# Patient Record
Sex: Female | Born: 1980
Health system: Southern US, Community
[De-identification: ages and names within clinical notes are randomized; demographics above are authoritative.]

## PROBLEM LIST (undated history)

## (undated) DIAGNOSIS — I498 Other specified cardiac arrhythmias: Secondary | ICD-10-CM

## (undated) DIAGNOSIS — N390 Urinary tract infection, site not specified: Secondary | ICD-10-CM

## (undated) DIAGNOSIS — K8020 Calculus of gallbladder without cholecystitis without obstruction: Secondary | ICD-10-CM

## (undated) DIAGNOSIS — J189 Pneumonia, unspecified organism: Secondary | ICD-10-CM

## (undated) DIAGNOSIS — R000 Tachycardia, unspecified: Secondary | ICD-10-CM

## (undated) DIAGNOSIS — I951 Orthostatic hypotension: Secondary | ICD-10-CM

## (undated) DIAGNOSIS — Z9889 Other specified postprocedural states: Secondary | ICD-10-CM

## (undated) DIAGNOSIS — R112 Nausea with vomiting, unspecified: Secondary | ICD-10-CM

## (undated) DIAGNOSIS — K589 Irritable bowel syndrome without diarrhea: Secondary | ICD-10-CM

## (undated) DIAGNOSIS — K219 Gastro-esophageal reflux disease without esophagitis: Secondary | ICD-10-CM

## (undated) DIAGNOSIS — G90A Postural orthostatic tachycardia syndrome (POTS): Secondary | ICD-10-CM

## (undated) HISTORY — DX: Irritable bowel syndrome, unspecified: K58.9

## (undated) HISTORY — DX: Calculus of gallbladder without cholecystitis without obstruction: K80.20

## (undated) HISTORY — DX: Gastro-esophageal reflux disease without esophagitis: K21.9

## (undated) HISTORY — PX: WISDOM TOOTH EXTRACTION: SHX21

## (undated) HISTORY — DX: Urinary tract infection, site not specified: N39.0

## (undated) HISTORY — DX: Pneumonia, unspecified organism: J18.9

---

## 2009-02-15 ENCOUNTER — Ambulatory Visit: Payer: Self-pay | Admitting: Family Medicine

## 2009-02-15 ENCOUNTER — Encounter: Payer: Self-pay | Admitting: Family Medicine

## 2009-02-15 DIAGNOSIS — IMO0002 Reserved for concepts with insufficient information to code with codable children: Secondary | ICD-10-CM | POA: Insufficient documentation

## 2009-02-15 DIAGNOSIS — R5383 Other fatigue: Secondary | ICD-10-CM

## 2009-02-15 DIAGNOSIS — R5381 Other malaise: Secondary | ICD-10-CM | POA: Insufficient documentation

## 2009-02-15 LAB — CONVERTED CEMR LAB
BUN: 12 mg/dL (ref 6–23)
Basophils Absolute: 0 10*3/uL (ref 0.0–0.1)
Basophils Relative: 1 % (ref 0.0–3.0)
Creatinine, Ser: 0.7 mg/dL (ref 0.4–1.2)
Eosinophils Absolute: 0 10*3/uL (ref 0.0–0.7)
GFR calc non Af Amer: 105.15 mL/min (ref >60)
Glucose, Bld: 94 mg/dL (ref 70–99)
HCT: 39.5 % (ref 36.0–46.0)
Hemoglobin: 13.3 g/dL (ref 12.0–15.0)
Lymphs Abs: 1.2 10*3/uL (ref 0.7–4.0)
MCHC: 33.6 g/dL (ref 30.0–36.0)
MCV: 94.8 fL (ref 78.0–100.0)
Neutro Abs: 1.9 10*3/uL (ref 1.4–7.7)
RBC: 4.16 M/uL (ref 3.87–5.11)
RDW: 11.7 % (ref 11.5–14.6)

## 2009-02-21 LAB — CONVERTED CEMR LAB: Vit D, 25-Hydroxy: 21 ng/mL — ABNORMAL LOW (ref 30–89)

## 2009-02-23 ENCOUNTER — Ambulatory Visit: Payer: Self-pay | Admitting: Family Medicine

## 2009-02-23 ENCOUNTER — Encounter: Payer: Self-pay | Admitting: Family Medicine

## 2009-05-06 ENCOUNTER — Ambulatory Visit: Payer: Self-pay | Admitting: Family Medicine

## 2009-05-06 ENCOUNTER — Encounter: Payer: Self-pay | Admitting: Family Medicine

## 2009-05-06 DIAGNOSIS — E559 Vitamin D deficiency, unspecified: Secondary | ICD-10-CM | POA: Insufficient documentation

## 2009-08-16 ENCOUNTER — Ambulatory Visit: Payer: Self-pay | Admitting: Family Medicine

## 2009-08-16 LAB — CONVERTED CEMR LAB
Alkaline Phosphatase: 43 units/L (ref 39–117)
Bilirubin, Direct: 0.1 mg/dL (ref 0.0–0.3)
CO2: 30 meq/L (ref 19–32)
Calcium: 9.1 mg/dL (ref 8.4–10.5)
Creatinine, Ser: 0.8 mg/dL (ref 0.4–1.2)
HDL: 68.7 mg/dL (ref >39.00)
LDL Cholesterol: 93 mg/dL (ref 0–99)
Sodium: 142 meq/L (ref 135–145)
Total Bilirubin: 0.3 mg/dL (ref 0.3–1.2)
Total CHOL/HDL Ratio: 2
Total Protein: 6.3 g/dL (ref 6.0–8.3)
Triglycerides: 46 mg/dL (ref 0.0–149.0)

## 2009-08-19 LAB — CONVERTED CEMR LAB: Vit D, 25-Hydroxy: 33 ng/mL (ref 30–89)

## 2009-08-28 ENCOUNTER — Other Ambulatory Visit: Admission: RE | Admit: 2009-08-28 | Discharge: 2009-08-28 | Payer: Self-pay | Admitting: Family Medicine

## 2009-08-28 ENCOUNTER — Ambulatory Visit: Payer: Self-pay | Admitting: Family Medicine

## 2009-08-28 DIAGNOSIS — M2550 Pain in unspecified joint: Secondary | ICD-10-CM | POA: Insufficient documentation

## 2009-08-29 ENCOUNTER — Encounter: Payer: Self-pay | Admitting: Family Medicine

## 2009-09-03 ENCOUNTER — Encounter (INDEPENDENT_AMBULATORY_CARE_PROVIDER_SITE_OTHER): Payer: Self-pay | Admitting: *Deleted

## 2009-09-24 ENCOUNTER — Encounter: Payer: Self-pay | Admitting: Family Medicine

## 2009-11-01 ENCOUNTER — Encounter: Payer: Self-pay | Admitting: Family Medicine

## 2009-12-09 ENCOUNTER — Ambulatory Visit: Payer: Self-pay | Admitting: Family Medicine

## 2009-12-09 DIAGNOSIS — N912 Amenorrhea, unspecified: Secondary | ICD-10-CM | POA: Insufficient documentation

## 2010-05-13 ENCOUNTER — Observation Stay: Payer: Self-pay | Admitting: Obstetrics and Gynecology

## 2010-06-24 NOTE — Consult Note (Signed)
Summary: Stacey Drain MD Rheumatology  Stacey Drain MD Rheumatology   Imported By: Lanelle Bal 10/03/2009 13:42:34  _____________________________________________________________________  External Attachment:    Type:   Image     Comment:   External Document  Appended Document: Stacey Drain MD Rheumatology probable fibromyalgia.

## 2010-06-24 NOTE — Letter (Signed)
Summary: Results Follow up Letter  [facility]  [address] [place], [place] [address]   Phone: [phone]  Fax: [phone]    09/03/2009 MRN: [id number]    Carla Griffin [address] [place], [place]  [address]    Dear [name],  The following are the results of your recent test(s):  Test         Result    Pap Smear:        Normal __X___  Not Normal _____ Comments:   Repeat in 2 years. ______________________________________________________ Cholesterol: LDL(Bad cholesterol):         Your goal is less than:         HDL (Good cholesterol):       Your goal is more than: Comments:  ______________________________________________________ Mammogram:        Normal _____  Not Normal _____ Comments:  ___________________________________________________________________ Hemoccult:        Normal _____  Not normal _______ Comments:    _____________________________________________________________________ Other Tests:    We routinely do not discuss normal results over the telephone.  If you desire a copy of the results, or you have any questions about this information we can discuss them at your next office visit.   Sincerely,    Ruthe Mannan,  M.D.  TA:lsf

## 2010-06-24 NOTE — Miscellaneous (Signed)
Summary: Orders Update  Clinical Lists Changes  Orders: Added new Referral order of Rheumatology Referral (Rheumatology) - Signed 

## 2010-06-24 NOTE — Assessment & Plan Note (Signed)
Summary: CONFIRM HOME POSITIVE PREGNANCY TEST / LFW   Vital Signs:  Patient profile:   30 year old female Weight:      139.13 pounds Temp:     98.5 degrees F oral Pulse rate:   84 / minute Pulse rhythm:   regular BP sitting:   132 / 64  (left arm) Cuff size:   regular  Vitals Entered By: Janee Morn, CMA 12-09-09, 8:21am CC: + Home pregnancy Test   History of Present Illness: 28 here for ?pregnancy. LMP on 10/28/2009. Took two home pregnancy tests last week, both positive. She and her husband have been trying to get pregnant for a couple of months. She is taking a PNV daily.  Feels ok- no nausea or vomiting. No fatigue.   Breasts are tender.    Current Medications (verified): 1)  Vitamin D 1000 Unit  Tabs (Cholecalciferol) .... Take 1 Tablet By Mouth Once A Day  Allergies (verified): No Known Drug Allergies  Past History:  Past Medical History: Last updated: 02/15/2009 Unremarkable  Family History: Last updated: 02/15/2009 Mom- Lupus Dad - HLD, HTN  Social History: Last updated: 02/15/2009 Lives with husband in [place].  She is a Engineer, civil (consulting) at [employer], ortho floor. Active in her church.   Alcohol use-no Drug use-no Regular exercise-yes  Risk Factors: Exercise: yes (02/15/2009)  Review of Systems      See HPI General:  Denies malaise. GI:  Denies nausea and vomiting.  Physical Exam  General:  Well-developed,well-nourished,in no acute distress; alert,appropriate and cooperative throughout examination Psych:  normally interactive and good eye contact.     Impression & Recommendations:  Problem # 1:  PREGNANCY (ICD-V22.2) Assessment New Approximately 6 weeks gestsation based on LMP. Refer to OBGYN. Orders: Obstetric Referral (Obstetric)  Complete Medication List: 1)  Vitamin D 1000 Unit Tabs (Cholecalciferol) .... Take 1 tablet by mouth once a day  Other Orders: Urine Pregnancy Test  ([address])  Patient Instructions: 1)  Congratulations.    2)  Please stop by to see Shirlee Limerick on your way out!  Current Allergies (reviewed today): No known allergies   Laboratory Results   Urine Tests  Date/Time Received: December 09, 2009 8:32 AM  Date/Time Reported: December 09, 2009 8:32 AM     Urine HCG: positive

## 2010-06-24 NOTE — Letter (Signed)
Summary: Aundra Dubin MD  Aundra Dubin MD   Imported By: Lester Lewisburg 11/09/2009 11:09:30  _____________________________________________________________________  External Attachment:    Type:   Image     Comment:   External Document

## 2010-06-24 NOTE — Assessment & Plan Note (Signed)
Summary: cpx with pap/ alc   Vital Signs:  Patient profile:   30 year old female LMP:     08/08/2009 Height:      66.5 inches Weight:      140.25 pounds BMI:     22.38 Temp:     98.4 degrees F oral Pulse rate:   84 / minute Pulse rhythm:   regular BP sitting:   112 / 68  (left arm) Cuff size:   regular  Vitals Entered By: Delilah Shan CMA Duncan Dull) ([place]  6, 2011 8:33 AM) CC: CPX with Pap. LMP (date): 08/08/2009     Enter LMP: 08/08/2009 Last PAP Result normal   History of Present Illness: 30 yo female here for CPX/Pap.   Still having fatigue and right leg pain.  MRI of lumbar spine was negative. Now back and leg pain are travelling to left side as well.  Still fatigued.   No blood in stool, bleeding gums, or bruising easily.  Periods are very light since she has been on OCPs last 2 years.   Denies any symptoms of depression or anxiety. CBC and TSH were normal in September.  Vit D was low, but better now with supplementation.   Denies any morning stiffness or redness of her joints.  Sometimes she feels like her right knee is swollen.  Mom does have Lupus. No rashes. Never any pain in her upper body- hands, arms, etc.  Well woman- sexually active with husband only.  Using OCPs.  Never had a h/o stds or abnormal pap smears.  LMP was 01/11/09, regular.  She is going to stop taking Loestrin, they are ready to start a family!  Current Medications (verified): 1)  Loestrin 24 Fe 1-20 Mg-Mcg Tabs (Norethin Ace-Eth Estrad-Fe) .... Take 1 Tablet By Mouth Once A Day 2)  Eq Naproxen Sodium 220 Mg Tabs (Naproxen Sodium) .Marland Kitchen.. 1-2 Tablets As Needed 3)  Vitamin D 1000 Unit  Tabs (Cholecalciferol) .... Take 1 Tablet By Mouth Once A Day  Allergies (verified): No Known Drug Allergies  Past History:  Family History: Last updated: 02/15/2009 Mom- Lupus Dad - HLD, HTN  Social History: Last updated: 02/15/2009 Lives with husband in [place].  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   Alcohol use-no Drug use-no Regular exercise-yes  Review of Systems      See HPI General:  Complains of fatigue; denies chills and fever. Eyes:  Denies blurring. ENT:  Denies difficulty swallowing. CV:  Denies chest pain or discomfort. Resp:  Denies shortness of breath. GI:  Denies abdominal pain, bloody stools, and change in bowel habits. GU:  Denies abnormal vaginal bleeding. MS:  Complains of joint pain and joint swelling; denies joint redness, loss of strength, muscle aches, muscle weakness, stiffness, and thoracic pain. Psych:  Denies anxiety and depression.  Physical Exam  General:  Well-developed,well-nourished,in no acute distress; alert,appropriate and cooperative throughout examination Eyes:  No corneal or conjunctival inflammation noted. EOMI. Perrla. Funduscopic exam benign, without hemorrhages, exudates or papilledema. Vision grossly normal. Ears:  External ear exam shows no significant lesions or deformities.  Otoscopic examination reveals clear canals, tympanic membranes are intact bilaterally without bulging, retraction, inflammation or discharge. Hearing is grossly normal bilaterally. Mouth:  Oral mucosa and oropharynx without lesions or exudates.  Teeth in good repair. Neck:  No deformities, masses, or tenderness noted. Lungs:  Normal respiratory effort, chest expands symmetrically. Lungs are clear to auscultation, no crackles or wheezes. Heart:  Normal rate and regular rhythm. S1 and S2 normal without gallop, murmur, click, rub or other extra sounds. Abdomen:  Bowel sounds positive,abdomen soft and non-tender without masses, organomegaly or hernias noted. Genitalia:  Pelvic Exam:        External: normal female genitalia without lesions or masses        Vagina: normal without lesions or masses        Cervix: normal without lesions or masses        Adnexa: normal bimanual exam without masses or fullness        Uterus: normal by  palpation        Pap smear: performed Msk:  No deformity or scoliosis noted of thoracic or lumbar spine.  no joint tenderness, no joint swelling, no joint warmth, no redness over joints, no joint deformities, no joint instability, and no crepitation.   Neurologic:  No cranial nerve deficits noted. Station and gait are normal. Plantar reflexes are down-going bilaterally. DTRs are symmetrical throughout. Sensory, motor and coordinative functions appear intact. Psych:  normally interactive and good eye contact.     Impression & Recommendations:  Problem # 1:  PREVENTIVE HEALTH CARE (ICD-V70.0) Reviewed preventive care protocols, scheduled due services, and updated immunizations Discussed nutrition, exercise, diet, and healthy lifestyle.  Pap today. Lipids looked great. Disuccsed taking PNV daily.  Problem # 2:  UNSPECIFIED VITAMIN D DEFICIENCY (ICD-268.9) Assessment: Improved Continue daily supplementation of 800- 1000 units/day.  Recheck in 6 months.  Problem # 3:  ARTHRALGIA (ICD-719.40) Assessment: Deteriorated Differential is wide.  While she does have low back pain with radiculopathy, that seems to ahve improved and MRI was negative. I question a possible rheumatological etiology at this point.  Will check CRP, SED rate, ANA and RA. May need rheum referral. Orders: Venipuncture (46962) TLB-CRP-High Sensitivity (C-Reactive Protein) (86140-FCRP) TLB-Sedimentation Rate (ESR) (85652-ESR) TLB-Rheumatoid Factor (RA) (95284-XL) T-Antinuclear Antib (ANA) (24401-02725) Specimen Handling (36644)  Complete Medication List: 1)  Loestrin 24 Fe 1-20 Mg-mcg Tabs (Norethin ace-eth estrad-fe) .... Take 1 tablet by mouth once a day 2)  Eq Naproxen Sodium 220 Mg Tabs (Naproxen sodium) .Marland Kitchen.. 1-2 tablets as needed 3)  Vitamin D 1000 Unit Tabs (Cholecalciferol) .... Take 1 tablet by mouth once a day  Other Orders: Pap Smear, Thin Prep ( Collection of) ([id number])  Patient Instructions: 1)  Great to  see you, Klaudia. 2)  Let get a few more blood test today. 3)  I will call you with the results by Friday (sometimes the autoimmune panels take more than a day to come back). 4)  Start taking an over the counter prenatal vitamin.

## 2010-08-11 ENCOUNTER — Inpatient Hospital Stay: Payer: Self-pay

## 2011-04-17 ENCOUNTER — Ambulatory Visit (INDEPENDENT_AMBULATORY_CARE_PROVIDER_SITE_OTHER): Payer: PRIVATE HEALTH INSURANCE | Admitting: Family Medicine

## 2011-04-17 ENCOUNTER — Encounter: Payer: Self-pay | Admitting: Family Medicine

## 2011-04-17 VITALS — BP 110/80 | HR 90 | Temp 98.5°F | Wt 144.0 lb

## 2011-04-17 DIAGNOSIS — J029 Acute pharyngitis, unspecified: Secondary | ICD-10-CM

## 2011-04-17 MED ORDER — AMOXICILLIN 875 MG PO TABS: 875.0000 mg | ORAL_TABLET | Freq: Two times a day (BID) | ORAL | Status: AC

## 2011-04-17 NOTE — Patient Instructions (Signed)
Start the antibiotics today.  Drink plenty of fluids, take tylenol as needed, and gargle with warm salt water for your throat.  This should gradually improve.  Take care.  Let us know if you have other concerns.  Use back up birth control.

## 2011-04-17 NOTE — Progress Notes (Signed)
Carla Griffin, son born March 20th, NSVD.  Not breastfeeding. RST neg.   duration of symptoms: 1-2 days Rhinorrhea: minimal Congestion: no ear pain: mild sore throat:yes Cough: no Myalgias:yes, better with tylenol other concerns: no known fevers  ROS: See HPI.  Otherwise negative.    Meds, vitals, and allergies reviewed.   GEN: nad, alert and oriented HEENT: mucous membranes moist, TM w/o erythema, nasal epithelium injected, OP with cobblestoning and exudates noted NECK: supple w/ tender LA CV: rrr. PULM: ctab, no inc wob ABD: soft, +bs EXT: no edema

## 2011-04-19 ENCOUNTER — Encounter: Payer: Self-pay | Admitting: Family Medicine

## 2011-04-19 DIAGNOSIS — J029 Acute pharyngitis, unspecified: Secondary | ICD-10-CM | POA: Insufficient documentation

## 2011-04-19 NOTE — Assessment & Plan Note (Addendum)
4/5 for strep with no cough, + LA, exudates and ST.  I would treat for strep given her exam.  Start amoxil and f/u prn.  She agrees.  She may have a false neg RST today.

## 2011-12-30 ENCOUNTER — Ambulatory Visit (INDEPENDENT_AMBULATORY_CARE_PROVIDER_SITE_OTHER): Payer: PRIVATE HEALTH INSURANCE | Admitting: Family Medicine

## 2011-12-30 ENCOUNTER — Encounter: Payer: Self-pay | Admitting: Family Medicine

## 2011-12-30 ENCOUNTER — Telehealth: Payer: Self-pay | Admitting: Family Medicine

## 2011-12-30 VITALS — BP 120/80 | Temp 99.0°F | Wt 142.0 lb

## 2011-12-30 DIAGNOSIS — J029 Acute pharyngitis, unspecified: Secondary | ICD-10-CM

## 2011-12-30 MED ORDER — PENICILLIN V POTASSIUM 500 MG PO TABS: 500.0000 mg | ORAL_TABLET | Freq: Three times a day (TID) | ORAL | Status: AC

## 2011-12-30 NOTE — Progress Notes (Signed)
SUBJECTIVE: 31 y.o. female with sore throat, myalgias, swollen glands, headache and fever for 3 days. No history of rheumatic fever. Other symptoms: myalgias, headache and fever.  Tmax 102.  Patient Active Problem List  Diagnosis  . UNSPECIFIED VITAMIN D DEFICIENCY  . AMENORRHEA  . ARTHRALGIA  . BACK PAIN WITH RADICULOPATHY  . FATIGUE  . Pharyngitis   No past medical history on file. No past surgical history on file. History  Substance Use Topics  . Smoking status: Never Smoker   . Smokeless tobacco: Not on file  . Alcohol Use: No   Family History  Problem Relation Age of Onset  . Lupus Mother   . Hyperlipidemia Father   . Hypertension Father    No Known Allergies Current Outpatient Prescriptions on File Prior to Visit  Medication Sig Dispense Refill  . Cholecalciferol (VITAMIN D3) 1000 UNITS CAPS Take 1 capsule by mouth daily.        . norethindrone-ethinyl estradiol (JUNEL FE,GILDESS FE,LOESTRIN FE) 1-20 MG-MCG tablet Take 1 tablet by mouth daily.       The PMH, PSH, Social History, Family History, Medications, and allergies have been reviewed in [facility], and have been updated if relevant.  OBJECTIVE:  BP 120/80  Temp 99 F (37.2 C)  Wt 142 lb (64.411 kg)  Appears alert, well appearing, and in no distress. Ears: bilateral TM's and external ear canals normal Oropharynx: erythematous and tonsils hypertrophied with exudate Neck: supple, shotty adenopathy Lungs: clear to auscultation, no wheezes, rales or rhonchi, symmetric air entry Rapid Strep test is negative  ASSESSMENT: Streptococcal pharyngitis  PLAN: Per orders. Gargle, use acetaminophen or other OTC analgesic, and take Rx fully as prescribed. Call if other family members develop similar symptoms. See prn.

## 2011-12-30 NOTE — Telephone Encounter (Signed)
Caller: Layia/Mother; PCP: Ruthe Mannan (Nestor Ramp); CB#: [phone]; ; ; Call regarding Sore Throat;   Pt calling  with body aches, HA fever  up to 102  and sore throat that started 12/28/2011 . No  fever since 12/29/2011,   but still with sore throat. Have been taking Tylenol or Motrin and has helped with fever and body aches , but not sore throat. Rating throat   pain  7/10 duiring call . Has seen yellow  patches on back of throat. RN reached See in 24 hrs Disposition  for enlarged tonsils with yellow patches per Sore throat or Hoarseness protocol and scheduled office visit for today at 10:30  with Dr Dayton Martes

## 2011-12-30 NOTE — Progress Notes (Signed)
SUBJECTIVE: 31 y.o. female with sore throat, myalgias, swollen glands, headache and fever for 3 days. No history of rheumatic fever. Other symptoms: fever and chills.  Patient Active Problem List  Diagnosis  . UNSPECIFIED VITAMIN D DEFICIENCY  . AMENORRHEA  . ARTHRALGIA  . BACK PAIN WITH RADICULOPATHY  . FATIGUE  . Pharyngitis   No past medical history on file. No past surgical history on file. History  Substance Use Topics  . Smoking status: Never Smoker   . Smokeless tobacco: Not on file  . Alcohol Use: No   Family History  Problem Relation Age of Onset  . Lupus Mother   . Hyperlipidemia Father   . Hypertension Father    No Known Allergies Current Outpatient Prescriptions on File Prior to Visit  Medication Sig Dispense Refill  . Cholecalciferol (VITAMIN D3) 1000 UNITS CAPS Take 1 capsule by mouth daily.        . NON FORMULARY BCP's        The PMH, PSH, Social History, Family History, Medications, and allergies have been reviewed in [facility], and have been updated if relevant.  OBJECTIVE:  There were no vitals taken for this visit.  Vitals as noted above. Appears alert, well appearing, and in no distress. Ears: bilateral TM's and external ear canals normal Oropharynx: tonsils hypertrophied with exudate Neck: supple, shotty lymphadenopathy Lungs: clear to auscultation, no wheezes, rales or rhonchi, symmetric air entry Rapid Strep test is negative  ASSESSMENT/PLAN: pharyngitis- likely viral.  Rapid strep neg (culture swab obtained).  Will send throat cx.  If positive , start PCN (rx given).  Gargle, use acetaminophen or other OTC analgesic, and take Rx fully as prescribed. Call if other family members develop similar symptoms. See prn.

## 2011-12-30 NOTE — Patient Instructions (Addendum)
Great to see you. This likely a virus. Take Ibuprofen up to 800 mg three times daily with food for next several days. We will call you with your culture results. Ok to fill you penicillin if you feel worse (or obviously if culture is positive).

## 2012-01-01 LAB — CULTURE, GROUP A STREP: Organism ID, Bacteria: NORMAL

## 2012-05-25 HISTORY — PX: CHOLECYSTECTOMY: SHX55

## 2012-10-13 ENCOUNTER — Observation Stay: Payer: Self-pay | Admitting: Obstetrics and Gynecology

## 2012-10-13 LAB — PIH PROFILE
BUN: 9 mg/dL (ref 7–18)
Calcium, Total: 9.3 mg/dL (ref 8.5–10.1)
Co2: 27 mmol/L (ref 21–32)
Creatinine: 0.63 mg/dL (ref 0.60–1.30)
EGFR (Non-African Amer.): >60
HCT: 33.7 % — ABNORMAL LOW (ref 35.0–47.0)
MCHC: 34.3 g/dL (ref 32.0–36.0)
Osmolality: 269 (ref 275–301)
Potassium: 4.3 mmol/L (ref 3.5–5.1)
RDW: 13.1 % (ref 11.5–14.5)
SGOT(AST): 30 U/L (ref 15–37)
Uric Acid: 3.7 mg/dL (ref 2.6–6.0)

## 2012-10-13 LAB — PROTEIN / CREATININE RATIO, URINE
Protein, Random Urine: 15 mg/dL — ABNORMAL HIGH (ref 0–12)
Protein/Creat. Ratio: 211 mg/gCREAT — ABNORMAL HIGH (ref 0–200)

## 2012-11-01 ENCOUNTER — Inpatient Hospital Stay: Payer: Self-pay

## 2012-11-01 LAB — CBC WITH DIFFERENTIAL/PLATELET
Basophil #: 0 10*3/uL (ref 0.0–0.1)
Basophil %: 0.3 %
Eosinophil #: 0 10*3/uL (ref 0.0–0.7)
HCT: 35.6 % (ref 35.0–47.0)
Lymphocyte #: 1.6 10*3/uL (ref 1.0–3.6)
MCH: 31.1 pg (ref 26.0–34.0)
MCV: 88 fL (ref 80–100)
Monocyte #: 0.6 x10 3/mm (ref 0.2–0.9)
Platelet: 206 10*3/uL (ref 150–440)
RBC: 4.03 10*6/uL (ref 3.80–5.20)
RDW: 13 % (ref 11.5–14.5)
WBC: 8.3 10*3/uL (ref 3.6–11.0)

## 2013-03-17 ENCOUNTER — Encounter: Payer: Self-pay | Admitting: Family Medicine

## 2013-03-17 ENCOUNTER — Ambulatory Visit: Payer: PRIVATE HEALTH INSURANCE | Admitting: Family Medicine

## 2013-03-17 ENCOUNTER — Emergency Department: Payer: Self-pay | Admitting: Emergency Medicine

## 2013-03-17 ENCOUNTER — Ambulatory Visit (INDEPENDENT_AMBULATORY_CARE_PROVIDER_SITE_OTHER): Payer: 59 | Admitting: Family Medicine

## 2013-03-17 VITALS — BP 122/76 | HR 104 | Temp 99.1°F | Wt 146.5 lb

## 2013-03-17 DIAGNOSIS — R1011 Right upper quadrant pain: Secondary | ICD-10-CM

## 2013-03-17 LAB — COMPREHENSIVE METABOLIC PANEL
Albumin: 3.9 g/dL (ref 3.4–5.0)
Alkaline Phosphatase: 95 U/L (ref 50–136)
BUN: 11 mg/dL (ref 7–18)
Bilirubin,Total: 0.9 mg/dL (ref 0.2–1.0)
Calcium, Total: 9.4 mg/dL (ref 8.5–10.1)
Chloride: 104 mmol/L (ref 98–107)
Co2: 24 mmol/L (ref 21–32)
Creatinine: 1.01 mg/dL (ref 0.60–1.30)
EGFR (African American): >60
Glucose: 83 mg/dL (ref 65–99)
Osmolality: 270 (ref 275–301)
Total Protein: 8 g/dL (ref 6.4–8.2)

## 2013-03-17 LAB — CBC
HCT: 43.1 % (ref 35.0–47.0)
HGB: 14.7 g/dL (ref 12.0–16.0)
MCHC: 34.1 g/dL (ref 32.0–36.0)
Platelet: 201 10*3/uL (ref 150–440)
RBC: 4.79 10*6/uL (ref 3.80–5.20)

## 2013-03-17 LAB — URINALYSIS, COMPLETE
Bacteria: NONE SEEN
Blood: NEGATIVE
Glucose,UR: NEGATIVE mg/dL (ref 0–75)
Ph: 5 (ref 4.5–8.0)
Protein: 30
RBC,UR: 4 /HPF (ref 0–5)
Specific Gravity: 1.03 (ref 1.003–1.030)
WBC UR: 4 /HPF (ref 0–5)

## 2013-03-17 NOTE — Assessment & Plan Note (Addendum)
1d h/o severe RUQ abdominal pain. + murphy sign on exam, fever to 100.7 last night. Concern for acute cholecystitis - I recommended ER evaluation today to expedite blood work and imaging. I called charge nurse Tammy Sours to notify pt is on her way.

## 2013-03-17 NOTE — Patient Instructions (Signed)
I am worried about acute cholecystitis or gallbladder infection (could be from a stone) given your fever overnight. I do recommend going to ER for evaluation (blood work and imaging study). We will call them to let them know you are coming.

## 2013-03-17 NOTE — Progress Notes (Signed)
  Subjective:    Patient ID: Carla Griffin, female    DOB: 1980/07/24, 32 y.o.   MRN: [id number]  HPI CC: UCC f/u  Presents with husband today.  Yesterday awoke at 5:30am with sharp RUQ pain that radiated to shoulderblade, shoulder and arm.  Seen at [facility] - thought likely gallbladder.  No blood work done.  Told to go to ER if worsening.  Pain with lifting son.  Day before ate at [employer].  + nausea and now with radiation of pain to mid back and LUQ. Tmax 100.7 overnight.  Treated at home with vicodin. No recent weight change.  No nausea/vomiting, jaundice, BM changes such as constipation.   H/o IBS - with some diarrhea longstanding for last 6 years.  Has been having R sided abd pain for last week - intermittent, with bloating. Recent pregnancy.  4 mo old at home.  History reviewed. No pertinent past medical history.   History reviewed. No pertinent past surgical history. Family History  Problem Relation Age of Onset  . Lupus Mother   . Hyperlipidemia Father   . Hypertension Father    Review of Systems Per HPI    Objective:   Physical Exam  Nursing note and vitals reviewed. Constitutional: She appears well-developed and well-nourished.  Uncomfortable appearing  HENT:  Mouth/Throat: Oropharynx is clear and moist. No oropharyngeal exudate.  Cardiovascular: Normal rate, regular rhythm, normal heart sounds and intact distal pulses.   No murmur heard. Pulmonary/Chest: Effort normal and breath sounds normal. No respiratory distress. She has no wheezes. She has no rales.  Abdominal: Soft. Normal appearance and bowel sounds are normal. She exhibits no distension and no mass. There is no hepatosplenomegaly. There is tenderness in the right upper quadrant and left upper quadrant. There is guarding and positive Murphy's sign. There is no rigidity, no rebound and no CVA tenderness.  Tender to palpation at RUQ most evident with inspiration.  Musculoskeletal: She exhibits no edema.   Skin: Skin is warm and dry. No rash noted.       Assessment & Plan:

## 2013-04-10 ENCOUNTER — Ambulatory Visit: Payer: Self-pay | Admitting: Surgery

## 2013-04-11 ENCOUNTER — Emergency Department: Payer: Self-pay | Admitting: Emergency Medicine

## 2013-04-11 LAB — URINALYSIS, COMPLETE
Bacteria: NONE SEEN
Ketone: NEGATIVE
Leukocyte Esterase: NEGATIVE
Nitrite: NEGATIVE
Ph: 6 (ref 4.5–8.0)
RBC,UR: 1 /HPF (ref 0–5)
Specific Gravity: 1.01 (ref 1.003–1.030)
WBC UR: 3 /HPF (ref 0–5)

## 2013-04-12 LAB — PATHOLOGY REPORT

## 2013-04-14 ENCOUNTER — Other Ambulatory Visit: Payer: Self-pay | Admitting: Surgery

## 2013-04-14 LAB — URINALYSIS, COMPLETE
Bilirubin,UR: NEGATIVE
Blood: NEGATIVE
Glucose,UR: NEGATIVE mg/dL (ref 0–75)
Leukocyte Esterase: NEGATIVE
Nitrite: NEGATIVE
Ph: 8 (ref 4.5–8.0)
RBC,UR: <1 /HPF (ref 0–5)
Specific Gravity: 1.004 (ref 1.003–1.030)
WBC UR: 3 /HPF (ref 0–5)

## 2013-09-22 ENCOUNTER — Ambulatory Visit (INDEPENDENT_AMBULATORY_CARE_PROVIDER_SITE_OTHER): Payer: 59 | Admitting: Internal Medicine

## 2013-09-22 ENCOUNTER — Encounter: Payer: Self-pay | Admitting: Internal Medicine

## 2013-09-22 VITALS — BP 126/84 | HR 113 | Temp 98.5°F | Wt 142.2 lb

## 2013-09-22 DIAGNOSIS — R232 Flushing: Secondary | ICD-10-CM

## 2013-09-22 DIAGNOSIS — N951 Menopausal and female climacteric states: Secondary | ICD-10-CM

## 2013-09-22 DIAGNOSIS — J019 Acute sinusitis, unspecified: Secondary | ICD-10-CM

## 2013-09-22 LAB — CBC WITH DIFFERENTIAL/PLATELET
Basophils Absolute: 0 10*3/uL (ref 0.0–0.1)
Basophils Relative: 0 % (ref 0–1)
Eosinophils Absolute: 0.1 10*3/uL (ref 0.0–0.7)
Eosinophils Relative: 1 % (ref 0–5)
HEMATOCRIT: 36.9 % (ref 36.0–46.0)
HEMOGLOBIN: 12.6 g/dL (ref 12.0–15.0)
Lymphocytes Relative: 17 % (ref 12–46)
Lymphs Abs: 1.4 10*3/uL (ref 0.7–4.0)
MCH: 30 pg (ref 26.0–34.0)
MCHC: 34.1 g/dL (ref 30.0–36.0)
MCV: 87.9 fL (ref 78.0–100.0)
MONO ABS: 0.7 10*3/uL (ref 0.1–1.0)
MONOS PCT: 8 % (ref 3–12)
NEUTROS ABS: 6.2 10*3/uL (ref 1.7–7.7)
Neutrophils Relative %: 74 % (ref 43–77)
Platelets: 269 10*3/uL (ref 150–400)
RBC: 4.2 MIL/uL (ref 3.87–5.11)
RDW: 13.3 % (ref 11.5–15.5)
WBC: 8.4 10*3/uL (ref 4.0–10.5)

## 2013-09-22 LAB — COMPREHENSIVE METABOLIC PANEL
ALBUMIN: 3.7 g/dL (ref 3.5–5.2)
ALK PHOS: 69 U/L (ref 39–117)
ALT: 44 U/L — ABNORMAL HIGH (ref 0–35)
AST: 29 U/L (ref 0–37)
BUN: 10 mg/dL (ref 6–23)
CO2: 25 meq/L (ref 19–32)
Calcium: 9.1 mg/dL (ref 8.4–10.5)
Chloride: 106 mEq/L (ref 96–112)
Creat: 0.72 mg/dL (ref 0.50–1.10)
GLUCOSE: 96 mg/dL (ref 70–99)
POTASSIUM: 4.2 meq/L (ref 3.5–5.3)
SODIUM: 140 meq/L (ref 135–145)
TOTAL PROTEIN: 6.3 g/dL (ref 6.0–8.3)
Total Bilirubin: 0.3 mg/dL (ref 0.2–1.2)

## 2013-09-22 LAB — TSH: TSH: 1.987 u[IU]/mL (ref 0.350–4.500)

## 2013-09-22 MED ORDER — AMOXICILLIN 500 MG PO CAPS: 500.0000 mg | ORAL_CAPSULE | Freq: Three times a day (TID) | ORAL | Status: DC

## 2013-09-22 NOTE — Progress Notes (Signed)
Subjective:    Patient ID: Carla Griffin, female    DOB: 1980-10-25, 33 y.o.   MRN: [id number]  HPI  Pt presents to the clinic today with c/o nasal congestion, chest congestion and cough. This started 1 week ago. The cough is non productive. She does have some associated sore throat but denies fever, chills or body aches. She has tried Mucinex D, sudafed, tylenol and benadryl. She has no hisotry of allergies or breathing problems. She has had sick contacts. She does not smoke.  On a side note, she c/o night sweats. She reports this occurs most every night and she sometimes has to change her clothes and her sheets it is so bad. She sleeps with a temp of 69 and a fan on in her home. She has not recently started or stopped any medication. She would like to have her hormone levels checked to make sure nothing is going on. She denies all other associated symptoms such as fever, cough, fatigue, mood swings.  Review of Systems      History reviewed. No pertinent past medical history.  Current Outpatient Prescriptions  Medication Sig Dispense Refill  . Multiple Vitamin (MULTIVITAMIN) tablet Take 1 tablet by mouth daily.      . norethindrone-ethinyl estradiol (JUNEL FE,GILDESS FE,LOESTRIN FE) 1-20 MG-MCG tablet Take 1 tablet by mouth daily.       No current facility-administered medications for this visit.    No Known Allergies  Family History  Problem Relation Age of Onset  . Lupus Mother   . Hyperlipidemia Father   . Hypertension Father     History   Social History  . Marital Status: Married    Spouse Name: N/A    Number of Children: N/A  . Years of Education: N/A   Occupational History  . 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 Smoking status: Never Smoker   . Smokeless tobacco: Not on file  . Alcohol Use: Yes     Comment: occasional  . Drug Use: No  . Sexual Activity: Not on file   Other Topics Concern  . Not on file   Social History Narrative     [place]   Active in her church.   Regular exercise:  Yes   RN at [employer]     Constitutional: Pt reports headache .Denies fever, malaise, fatigue, or abrupt weight changes.  HEENT: Pt reports facial pain and pressure, nasal congestions, sore throat. Denies eye pain, eye redness, ear pain, ringing in the ears, wax buildup, runny nose,  bloody nose. Respiratory: Denies difficulty breathing, shortness of breath, cough or sputum production.   Cardiovascular: Denies chest pain, chest tightness, palpitations or swelling in the hands or feet.     No other specific complaints in a complete review of systems (except as listed in HPI above).  Objective:   Physical Exam    BP 126/84  Pulse 113  Temp(Src) 98.5 F (36.9 C) (Oral)  Wt 142 lb 4 oz (64.524 kg)  SpO2 99%  LMP 09/11/2013 Wt Readings from Last 3 Encounters:  09/22/13 142 lb 4 oz (64.524 kg)  03/17/13 146 lb 8 oz (66.452 kg)  12/30/11 142 lb (64.411 kg)    General: Appears her stated age, well developed, well nourished in NAD. HEENT: Head: normal shape and size, maxillary sinus tenderness noted; Eyes: sclera white, no icterus, conjunctiva pink, PERRLA and EOMs intact; Ears: Tm's gray and intact,  normal light reflex; Nose: mucosa erythematous and moist, septum midline; Throat/Mouth: Teeth present, mucosa pink and moist, + PND, no exudate, lesions or ulcerations noted.  Cardiovascular: tachycardic with normal rhythm. S1,S2 noted.  No murmur, rubs or gallops noted. No JVD or BLE edema. No carotid bruits noted. Pulmonary/Chest: Normal effort and positive vesicular breath sounds. No respiratory distress. No wheezes, rales or ronchi noted.    BMET    Component Value Date/Time   NA 142 08/16/2009 0916   K 4.6 08/16/2009 0916   CL 107 08/16/2009 0916   CO2 30 08/16/2009 0916   GLUCOSE 90 08/16/2009 0916   BUN 12 08/16/2009 0916   CREATININE 0.8 08/16/2009 0916   CALCIUM 9.1 08/16/2009 0916   GFRNONAA 89.82  08/16/2009 0916    Lipid Panel     Component Value Date/Time   CHOL 171 08/16/2009 0916   TRIG 46.0 08/16/2009 0916   HDL 68.70 08/16/2009 0916   CHOLHDL 2 08/16/2009 0916   VLDL 9.2 08/16/2009 0916   LDLCALC 93 08/16/2009 0916    CBC    Component Value Date/Time   WBC 3.3* 02/15/2009 1130   RBC 4.16 02/15/2009 1130   HGB 13.3 02/15/2009 1130   HCT 39.5 02/15/2009 1130   PLT 193.0 02/15/2009 1130   MCV 94.8 02/15/2009 1130   MCHC 33.6 02/15/2009 1130   RDW 11.7 02/15/2009 1130   LYMPHSABS 1.2 02/15/2009 1130   MONOABS 0.2 02/15/2009 1130   EOSABS 0.0 02/15/2009 1130   BASOSABS 0.0 02/15/2009 1130    Hgb A1C No results found for this basename: HGBA1C       Assessment & Plan:   Acute bacterial sinusitis:  She has no history of allergies Will start Amoxil TID x 10 days Can use a Neti Pot as well Ibuprofen for pain and inflammation  Night sweats:  ? Hormonal versus use of birth control Will check TSH, FSH, LH and estrogen She is not a risk for TB, will hold off on chest xray  Will followup after labs come back

## 2013-09-22 NOTE — Patient Instructions (Addendum)

## 2013-09-22 NOTE — Progress Notes (Signed)
Pre visit review using our clinic review tool, if applicable. No additional management support is needed unless otherwise documented below in the visit note. 

## 2013-09-23 LAB — FOLLICLE STIMULATING HORMONE: FSH: 3.1 m[IU]/mL

## 2013-09-26 ENCOUNTER — Telehealth: Payer: Self-pay | Admitting: Family Medicine

## 2013-09-26 LAB — ESTROGENS, TOTAL: Estrogen: 106 pg/mL

## 2013-09-26 NOTE — Telephone Encounter (Signed)
Patient Information:  Caller Name: Avilyn  Phone: [phone]  Patient: Carla Griffin, Carla Griffin  Gender: Female  DOB: May 11, 1981  Age: 33 Years  PCP: Ruthe MannanAron, [facility](Family Practice)  Pregnant: No  Office Follow Up:  Does the office need to follow up with this patient?: Yes  Instructions For The Office: Pt is asking that something else be called in. Advised pt appt needed. 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 [facility].   RN Note:  Pt is asking that something else be called in. Advised pt appt needed. 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  Symptoms  Reason For Call & Symptoms: Pt calling regarding sinus infection Amox TID. Pt feels like she is not getting any better after 5 days on Amox.  Reviewed Health History In EMR: Yes  Reviewed Medications In EMR: Yes  Reviewed Allergies In EMR: Yes  Reviewed Surgeries / Procedures: Yes  Date of Onset of Symptoms: 09/22/2013  Treatments Tried: Amoxicillin.  Treatments Tried Worked: No OB / GYN:  LMP: 09/11/2013  Guideline(s) Used:  Sinus Pain and Congestion  Disposition Per Guideline:   Go to Office Now  Reason For Disposition Reached:   Severe sinus pain  Advice Given:  N/A  Patient Will Follow Care Advice:  YES

## 2013-09-26 NOTE — Telephone Encounter (Signed)
She needs to finish the course of amoxil first and if she is not feeling any better she should make a follow up apt

## 2013-09-27 NOTE — Telephone Encounter (Signed)
Left detailed msg on VM with information as instructed 

## 2014-04-30 ENCOUNTER — Ambulatory Visit (INDEPENDENT_AMBULATORY_CARE_PROVIDER_SITE_OTHER): Payer: BC Managed Care – PPO | Admitting: Internal Medicine

## 2014-04-30 ENCOUNTER — Encounter: Payer: Self-pay | Admitting: Internal Medicine

## 2014-04-30 VITALS — BP 118/66 | HR 78 | Temp 98.2°F | Wt 151.0 lb

## 2014-04-30 DIAGNOSIS — K219 Gastro-esophageal reflux disease without esophagitis: Secondary | ICD-10-CM

## 2014-04-30 NOTE — Progress Notes (Signed)
Pre visit review using our clinic review tool, if applicable. No additional management support is needed unless otherwise documented below in the visit note. 

## 2014-04-30 NOTE — Patient Instructions (Signed)

## 2014-04-30 NOTE — Progress Notes (Signed)
Subjective:    Patient ID: Carla Griffin, female    DOB: 12-14-1980, 33 y.o.   MRN: [id number]  HPI  Pt presents to the clinic today with c/o a burning sensation and a fullness in her chest/behind the sternum. It has been going on for a few months but it seems to be worse in the past month. She reports that she feels like something is stuck in her throat. It can happen anytime during the day or night. She has not been able to correlate it to any specific food. She denies cough or shortness of breath but has had occasional nausea. She has been a little more stressed over the last few months. She has tried zantac, tums OTC and tried changing her diet with some relief.  She did have her gallbladder removed in 2014.  Review of Systems      History reviewed. No pertinent past medical history.  Current Outpatient Prescriptions  Medication Sig Dispense Refill  . Multiple Vitamin (MULTIVITAMIN) tablet Take 1 tablet by mouth daily.    . norethindrone-ethinyl estradiol (JUNEL FE,GILDESS FE,LOESTRIN FE) 1-20 MG-MCG tablet Take 1 tablet by mouth daily.     No current facility-administered medications for this visit.    No Known Allergies  Family History  Problem Relation Age of Onset  . Lupus Mother   . Hyperlipidemia Father   . Hypertension Father     History   Social History  . Marital Status: Married    Spouse Name: N/A    Number of Children: N/A  . Years of Education: N/A   Occupational History  . 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 Smoking status: Never Smoker   . Smokeless tobacco: Not on file  . Alcohol Use: Yes     Comment: occasional  . Drug Use: No  . Sexual Activity: Not on file   Other Topics Concern  . Not on file   Social History Narrative   Lives with husband in [place].   Active in her church.   Regular exercise:  Yes   RN at [employer]     Constitutional: Denies fever, malaise, fatigue, headache or abrupt weight changes.    Respiratory: Denies difficulty breathing, shortness of breath, cough or sputum production.   Cardiovascular: Denies chest pain, chest tightness, palpitations or swelling in the hands or feet.  Gastrointestinal: Pt reports reflux and nausea. Denies abdominal pain, bloating, constipation, diarrhea or blood in the stool.   No other specific complaints in a complete review of systems (except as listed in HPI above).  Objective:   Physical Exam  BP 118/66 mmHg  Pulse 78  Temp(Src) 98.2 F (36.8 C) (Oral)  Wt 151 lb (68.493 kg)  SpO2 99% Wt Readings from Last 3 Encounters:  04/30/14 151 lb (68.493 kg)  09/22/13 142 lb 4 oz (64.524 kg)  03/17/13 146 lb 8 oz (66.452 kg)    General: Appears her stated age, well developed, well nourished in NAD. Cardiovascular: Normal rate and rhythm. S1,S2 noted.  No murmur, rubs or gallops noted.  Pulmonary/Chest: Normal effort and positive vesicular breath sounds. No respiratory distress. No wheezes, rales or ronchi noted.  Abdomen: Soft and tender in the epigastric area. Normal bowel sounds, no bruits noted. No distention or masses noted. Liver, spleen and kidneys non palpable.  BMET    Component Value Date/Time   NA 140 09/22/2013 1551   K 4.2 09/22/2013 1551   CL  106 09/22/2013 1551   CO2 25 09/22/2013 1551   GLUCOSE 96 09/22/2013 1551   BUN 10 09/22/2013 1551   CREATININE 0.72 09/22/2013 1551   CREATININE 0.8 08/16/2009 0916   CALCIUM 9.1 09/22/2013 1551   GFRNONAA 89.82 08/16/2009 0916    Lipid Panel     Component Value Date/Time   CHOL 171 08/16/2009 0916   TRIG 46.0 08/16/2009 0916   HDL 68.70 08/16/2009 0916   CHOLHDL 2 08/16/2009 0916   VLDL 9.2 08/16/2009 0916   LDLCALC 93 08/16/2009 0916    CBC    Component Value Date/Time   WBC 8.4 09/22/2013 1551   RBC 4.20 09/22/2013 1551   HGB 12.6 09/22/2013 1551   HCT 36.9 09/22/2013 1551   PLT 269 09/22/2013 1551   MCV 87.9 09/22/2013 1551   MCH 30.0 09/22/2013 1551   MCHC  34.1 09/22/2013 1551   RDW 13.3 09/22/2013 1551   LYMPHSABS 1.4 09/22/2013 1551   MONOABS 0.7 09/22/2013 1551   EOSABS 0.1 09/22/2013 1551   BASOSABS 0.0 09/22/2013 1551    Hgb A1C No results found for: HGBA1C       Assessment & Plan:   GERD:  Stop Zantac Try Priloxec OTC x 2 weeks On to continue tums as needed If no improvement, will give RX for prescription prilosec Handout given on diet information for GERD  RTC as needed or if symptoms persist or worsen

## 2014-06-25 ENCOUNTER — Ambulatory Visit (INDEPENDENT_AMBULATORY_CARE_PROVIDER_SITE_OTHER): Payer: 59 | Admitting: Family Medicine

## 2014-06-25 ENCOUNTER — Encounter: Payer: Self-pay | Admitting: *Deleted

## 2014-06-25 ENCOUNTER — Encounter: Payer: Self-pay | Admitting: Family Medicine

## 2014-06-25 VITALS — BP 142/82 | HR 102 | Temp 98.0°F | Wt 153.8 lb

## 2014-06-25 DIAGNOSIS — J209 Acute bronchitis, unspecified: Secondary | ICD-10-CM | POA: Insufficient documentation

## 2014-06-25 MED ORDER — ALBUTEROL SULFATE HFA 108 (90 BASE) MCG/ACT IN AERS: 2.0000 | INHALATION_SPRAY | Freq: Four times a day (QID) | RESPIRATORY_TRACT | Status: DC | PRN

## 2014-06-25 MED ORDER — PREDNISONE (PAK) 10 MG PO TABS: ORAL_TABLET | Freq: Every day | ORAL | Status: DC

## 2014-06-25 NOTE — Patient Instructions (Addendum)
Good to see you. Take prednisone as directed, proair inhaler as needed for cough, wheeze or shortness of breath.  Call me midweek with an update- sooner if you are feeling better.  Please stop taking Claritin D.

## 2014-06-25 NOTE — Progress Notes (Signed)
Pre visit review using our clinic review tool, if applicable. No additional management support is needed unless otherwise documented below in the visit note. 

## 2014-06-25 NOTE — Assessment & Plan Note (Signed)
Deteriorated, now with wheezing. Given zpack at urgent care. Advised to d/c claritin d due to tachycardia and ineffectiveness. Start prednisone taper- discussed to take in am and with food. Also given rx for albuterol to use prn wheezing/SOB but advised that this can also cause tachycardia. Call or return to clinic prn if these symptoms worsen or fail to improve as anticipated. The patient indicates understanding of these issues and agrees with the plan.

## 2014-06-25 NOTE — Progress Notes (Signed)
Subjective:   Patient ID: Carla Griffin, female    DOB: Feb 16, 1981, 34 y.o.   MRN: [id number]  Carla Griffin is a pleasant 34 y.o. year old female who presents to clinic today with congestion in chest and Cough  on 06/25/2014  HPI:  2 weeks ago- started to develop URI symptoms- cough, runny nose, congestion but felt symptoms were improving.  2 days ago, started to develop shortness of breath, wheezing and worsening cough while at work ( she in an Char[employer] fundraiser).  Went to [facility]- given azithromycin, tussionex,  and advised claritin D.  No fevers.  Actually feels worse today- now she feels like she is wheezing.  Cough is persistent.  No other new symptoms.  Current Outpatient Prescriptions on File Prior to Visit  Medication Sig Dispense Refill  . Multiple Vitamin (MULTIVITAMIN) tablet Take 1 tablet by mouth daily.    . norethindrone-ethinyl estradiol (JUNEL FE,GILDESS FE,LOESTRIN FE) 1-20 MG-MCG tablet Take 1 tablet by mouth daily.    . ranitidine (ZANTAC) 150 MG capsule Take 150 mg by mouth 2 (two) times daily.     No current facility-administered medications on file prior to visit.    No Known Allergies  History reviewed. No pertinent past medical history.  History reviewed. No pertinent past surgical history.  Family History  Problem Relation Age of Onset  . Lupus Mother   . Hyperlipidemia Father   . Hypertension Father     History   Social History  . Marital Status: Married    Spouse Name: N/A    Number of Children: N/A  . Years of Education: N/A   Occupational History  . 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 Smoking status: Never Smoker   . Smokeless tobacco: Not on file  . Alcohol Use: Yes     Comment: occasional  . Drug Use: No  . Sexual Activity: Not on file   Other Topics Concern  . Not on file   Social History Narrative   Lives with husband in [place].   Active in her church.   Regular exercise:  Yes   RN at [employer]   The PMH,  PSH, Social History, Family History, Medications, and allergies have been reviewed in [facility], and have been updated if relevant.   Review of Systems  Constitutional: Positive for fatigue. Negative for fever.  HENT: Positive for congestion, postnasal drip and rhinorrhea.   Respiratory: Positive for cough, chest tightness, shortness of breath and wheezing.   Gastrointestinal: Negative.   Musculoskeletal: Negative.   Hematological: Negative.   Psychiatric/Behavioral: Negative.   All other systems reviewed and are negative.      Objective:    BP 142/82 mmHg  Pulse 102  Temp(Src) 98 F (36.7 C) (Oral)  Wt 153 lb 12 oz (69.741 kg)  SpO2 98%  LMP 06/12/2014   Physical Exam  Constitutional: She is oriented to person, place, and time. She appears well-developed and well-nourished. No distress.  HENT:  Head: Normocephalic.  Right Ear: Hearing and tympanic membrane normal.  Left Ear: Hearing and tympanic membrane normal.  Nose: Rhinorrhea present. Right sinus exhibits no maxillary sinus tenderness and no frontal sinus tenderness. Left sinus exhibits no maxillary sinus tenderness and no frontal sinus tenderness.  Mouth/Throat: Uvula is midline and mucous membranes are normal. No oropharyngeal exudate.  Cardiovascular: Regular rhythm.  Tachycardia present.   Pulmonary/Chest: Effort normal. No respiratory distress. She has no decreased breath sounds.  She has wheezes in the right middle field, the right lower field and the left middle field. She has no rhonchi. She has no rales.  Abdominal: Soft.  Musculoskeletal: Normal range of motion. She exhibits no edema.  Neurological: She is alert and oriented to person, place, and time. No cranial nerve deficit.  Skin: Skin is warm and dry.  Psychiatric: Her behavior is normal. Judgment and thought content normal.  Nursing note and vitals reviewed.         Assessment & Plan:   Acute bronchitis, unspecified organism No Follow-up on  file.

## 2014-06-28 ENCOUNTER — Telehealth: Payer: Self-pay | Admitting: Family Medicine

## 2014-06-28 NOTE — Telephone Encounter (Signed)
PLEASE NOTE: All timestamps contained within this report are represented as [place] Standard Time. CONFIDENTIALTY NOTICE: This fax transmission is intended only for the addressee. It contains information that is legally privileged, confidential or otherwise protected from use or disclosure. If you are not the intended recipient, you are strictly prohibited from reviewing, disclosing, copying using or disseminating any of this information or taking any action in reliance on or regarding this information. If you have received this fax in error, please notify us immediately by telephone so that we can arrange for its return to us. Phone: [phone], Toll-Free: [phone], Fax: [phone] Page: 1 of 2 Call Id: [id number] [facility] - Client TELEPHONE ADVICE RECORD [facility] Patient Name: Carla Griffin Gender: Female DOB: August 05, 1980 Age: [age] Y 4 M 10 D Return Phone Number: [phone] (Primary) Address: City/State/Zip: [place] Client [facility] [facility] - [facility] Client Site  [facility] [place] - Day Physician Ruthe MannanAron, Talia Contact Type Call Call Type Triage / Clinical Relationship To Patient Self Appointment Disposition EMR Appointment Not Necessary Return Phone Number [phone] (Primary) Chief Complaint BREATHING - fast, heavy or wheezing Initial Comment Caller states she was seen on Saturday in Urgent Care, follow up on Monday- DX- Bronchitis. She is still having wheezing. Heart is still racing. PreDisposition Call Doctor Info pasted into Epic Yes Nurse Assessment Nurse: Yetta BarreJones, RN, Miranda Date/Time Lamount Cohen(Eastern Time): 06/28/2014 12:31:50 PM Confirm and document reason for call. If symptomatic, describe symptoms. ---Caller states she was seen in UC on Saturday and gave 1000mg  Azythromycin x 1. Seen by MD on Monday and told she had Bronchitis. She has been wheezing and put on Prednisone taper and Albuterol. Still  having wheezing and heart racing. Albuterol Q4-6 hrs (last dose 1 hr ago) Has the patient traveled out of the country within the last 30 days? ---No Does the patient require triage? ---Yes Related visit to physician within the last 2 weeks? ---Yes Does the PT have any chronic conditions? (i.e. diabetes, asthma, etc.) ---Yes List chronic conditions. ---GERD Did the patient indicate they were pregnant? ---No Guidelines Guideline Title Affirmed Question Affirmed Notes Nurse Date/Time (Eastern Time) Asthma Attack [1] Wheezing or coughing AND [2] hasn't used neb or inhaler twice AND [3] it's available Yetta BarreJones, RN, Miranda 06/28/2014 12:35:14 PM Asthma Attack MILD asthma attack (e.g., no SOB at rest, mild SOB with walking, speaks normally in sentences, Yetta BarreJones, RN, Miranda 06/28/2014 1:17:25 PM PLEASE NOTE: All timestamps contained within this report are represented as [place] Standard Time. CONFIDENTIALTY NOTICE: This fax transmission is intended only for the addressee. It contains information that is legally privileged, confidential or otherwise protected from use or disclosure. If you are not the intended recipient, you are strictly prohibited from reviewing, disclosing, copying using or disseminating any of this information or taking any action in reliance on or regarding this information. If you have received this fax in error, please notify us immediately by telephone so that we can arrange for its return to us. Phone: [phone], Toll-Free: [phone], Fax: [phone] Page: 2 of 2 Call Id: [id number] Guidelines Guideline Title Affirmed Question Affirmed Notes Nurse Date/Time Lamount Cohen(Eastern Time) mild wheezing) (all triage questions negative) Disp. Time Lamount Cohen(Eastern Time) Disposition Final User 06/28/2014 12:28:38 PM Send to Urgent Tenna ChildQueue Howe, Kim 06/28/2014 12:38:29 PM Urgent Home Treatment with Follow-Up Call Yetta BarreJones, RN, Miranda 06/28/2014 12:40:13 PM Send To RN Personal Yetta BarreJones, RN,  Miranda 06/28/2014 1:17:57 PM Home Care Yes Yetta BarreJones, RN, Lenetta QuakerMiranda Caller Understands: Yes  Disagree/Comply: Marine scientist Understands: Yes Disagree/Comply: Comply Care Advice Given Per Guideline URGENT HOME TREATMENT WITH FOLLOW-UP CALL CALL CENTER PROVIDES RN CALL-BACKS: * You should usually improve with the home treatment advice I give you * Call me back immediately if: you become worse before my followup call * I'll call you back in 30-60 minutes to see how you are doing ASTHMA QUICK-RELIEF MEDICINE (e.g., albuterol, salbutamol, Xopenex): * Give yourself a nebulizer or inhaler (4 puffs) treatment using your quick-relief medicine (e.g., albuterol) right now. * Then I'll call you back. * Give yourself another treatment in 20 minutes. CARE ADVICE given per Asthma Attack (Adult) guideline. HOME CARE: You should be able to treat this at home. REASSURANCE: It sounds like a mild asthma attack that we can treat at home. ASTHMA QUICK-RELIEF MEDICINE: * Start your quick-relief medicine (e.g., albuterol, salbutamol) at the first sign of any coughing or shortness of breath (don't wait for wheezing). * Use inhaler (2 puffs each time) or nebulizer every 4 hours. * The best 'cough medicine' for an adult with asthma is always the asthma medicine. NOTE: Don't use cough suppressants, but COUGH DROPS may help a tickly cough. * Continue the quick-relief asthma medicine until you have not wheezed or coughed for 48 hours. It takes a minimum of 7 days of medicine for lung function to return to normal. CALL BACK IF: * Wheezing is not improved after neb or inhaler * Inhaled asthma medicine (neb or MDI) is needed more often than every 4 hours * Wheezing is not completely cleared by 5 days * You become worse. CARE ADVICE given per Asthma Attack (Adult) guideline. After Care Instructions Given Call Event Type User Date / Time Description Comments User: Grier Rocher, RN Date/Time Lamount Cohen Time): 06/28/2014 1:17:07  PM Follow up call: She is feeling better after 2nd treatment.

## 2014-06-28 NOTE — Telephone Encounter (Signed)
Please call pt to check on her tomorrow. 

## 2014-06-28 NOTE — Telephone Encounter (Signed)
[facility] [facility] - Client TELEPHONE ADVICE RECORD [facility] Patient Name: Carla Griffin DOB: 1981/02/11 Initial Comment Caller states she was seen on Saturday in Urgent Care, follow up on Monday- DX- Bronchitis. She is still having wheezing. Heart is still racing. Nurse Assessment Nurse: Yetta BarreJones, RN, Miranda Date/Time (Eastern Time): 06/28/2014 12:31:50 PM Confirm and document reason for call. If symptomatic, describe symptoms. ---Caller states she was seen in UC on Saturday and gave 1000mg  Azythromycin x 1. Seen by MD on Monday and told she had Bronchitis. She has been wheezing and put on Prednisone taper and Albuterol. Still having wheezing and heart racing. Albuterol Q4-6 hrs (last dose 1 hr ago) Has the patient traveled out of the country within the last 30 days? ---No Does the patient require triage? ---Yes Related visit to physician within the last 2 weeks? ---Yes Does the PT have any chronic conditions? (i.e. diabetes, asthma, etc.) ---Yes List chronic conditions. ---GERD Did the patient indicate they were pregnant? ---No Guidelines Guideline Title Affirmed Question Affirmed Notes Asthma Attack [1] Wheezing or coughing AND [2] hasn't used neb or inhaler twice AND [3] it's Available  Asthma Attack MILD asthma attack (e.g., no SOB at rest, mild SOB with walking, speaks normally in sentences, mild wheezing) (all triage questions negative) Final Disposition User Home Care Yetta BarreJones, RN, Miranda Comments Follow up call: She is feeling better after 2nd treatment.

## 2014-06-29 NOTE — Telephone Encounter (Signed)
Spoke to pt who states that she is "doing much better" since she has started taking albuterol on a scheduled basis q4-6hrs versus taking prn

## 2014-06-29 NOTE — Telephone Encounter (Signed)
Thanks for the update.  I am happy to hear that she is feeling better.

## 2014-09-14 NOTE — Op Note (Signed)
PATIENT NAME:  Carla Griffin, Carla Griffin MR#:  [id number] DATE OF BIRTH:  [birth year]  DATE OF PROCEDURE:  04/10/2013  PREOPERATIVE DIAGNOSIS: Cholecystitis and cholelithiasis.   POSTOPERATIVE DIAGNOSIS: Cholecystitis and cholelithiasis.  OPERATION: Robot-assisted laparoscopic cholecystectomy.   SURGEON: Quentin Orealph L. Ely.   ANESTHESIA: General.   OPERATIVE PROCEDURE: With the patient in the supine position after induction of appropriate general anesthesia, the patient's abdomen was prepped with ChloraPrep and draped with sterile towels. The patient was appropriately padded and positioned. The patient was placed in the head down, feet up position. A small umbilical incision was made across the umbilicus and carried down through the subcutaneous tissue with Bovie electrocautery. Midline fascia and the umbilical defect were identified and opened into the peritoneal cavity. A 25 mm incision was made into the fascia and peritoneum. The abdomen was swept and no abnormalities were identified. No adhesions to the umbilicus. The da Vinci single site port was inserted without difficulty. The abdomen was then insufflated. The ports were placed in the standard fashion and docked to the robot without difficulty. The gallbladder was elevated superiorly and laterally, exposing multiple adhesions to the gallbladder wall. Tedious dissection was required to mobilize the gallbladder further. The hepatoduodenal ligament  was eventually evaluated and identified. The cystic artery and cystic duct were identified. The cystic duct was doubly clipped on the common duct side and divided. The cystic artery was doubly clipped and divided. The gallbladder was then dissected free from its bed and delivered using hook and cautery apparatus. Once the gallbladder was free, the robot arms were removed and the robot undocked from the midline port. The port was then removed with the gallbladder intact. Midline fascia was closed with figure-of-eight buried  sutures of 0 Maxon. The umbilical skin was reapproximated using 3-0 Vicryl to reapproximate the belly button and then skin was closed using 5-0 nylon vertical mattress fashion. The area was infiltrated with 0.25% Marcaine for postoperative pain control. Sterile dressings were applied. The patient was returned to the recovery room, having tolerated the procedure well. Sponge, instrument and needle counts were correct x 2 in the operating room.   ____________________________ Quentin Orealph L. Ely III, MD rle:aw D: 04/10/2013 14:24:58 ET T: 04/10/2013 14:58:00 ET JOB#: [id number]  cc: Quentin Orealph L. Ely III, MD, <Dictator> Quentin OreALPH L ELY MD ELECTRONICALLY SIGNED 04/13/2013 19:03

## 2014-09-27 ENCOUNTER — Encounter: Payer: Self-pay | Admitting: Internal Medicine

## 2014-09-27 ENCOUNTER — Ambulatory Visit (INDEPENDENT_AMBULATORY_CARE_PROVIDER_SITE_OTHER): Payer: 59 | Admitting: Internal Medicine

## 2014-09-27 VITALS — BP 128/80 | HR 119 | Temp 98.3°F | Wt 154.0 lb

## 2014-09-27 DIAGNOSIS — J0110 Acute frontal sinusitis, unspecified: Secondary | ICD-10-CM | POA: Diagnosis not present

## 2014-09-27 MED ORDER — AMOXICILLIN 500 MG PO TABS: 1000.0000 mg | ORAL_TABLET | Freq: Two times a day (BID) | ORAL | Status: DC

## 2014-09-27 NOTE — Patient Instructions (Signed)
Please start the antibiotic if you are getting worse in the next few days.

## 2014-09-27 NOTE — Progress Notes (Signed)
Pre visit review using our clinic review tool, if applicable. No additional management support is needed unless otherwise documented below in the visit note. 

## 2014-09-27 NOTE — Progress Notes (Signed)
   Subjective:    Patient ID: Carla Griffin, female    DOB: [birth year], 34 y.o.   MRN: [id number]  HPI Here due to ear pain  Started with touch of bug--stomach and cold First symptoms about 2 weeks ago Son with similar symptoms Most symptoms have resolved but not the left  Ears are full --especially on right Hearing may be slightly muffled---hypersensitive (like when doors slammed)  Slight fever at first with the stomach upset No SOB Some AM cough--thick dark sputum (that she thinks is from PND) Some frontal headache--ibuprofen which helps  Current Outpatient Prescriptions on File Prior to Visit  Medication Sig Dispense Refill  . Multiple Vitamin (MULTIVITAMIN) tablet Take 1 tablet by mouth daily.    . ranitidine (ZANTAC) 150 MG capsule Take 150 mg by mouth 2 (two) times daily.     No current facility-administered medications on file prior to visit.    No Known Allergies  No past medical history on file.  No past surgical history on file.  Family History  Problem Relation Age of Onset  . Lupus Mother   . Hyperlipidemia Father   . Hypertension Father     History   Social History  . Marital Status: Married    Spouse Name: N/A  . Number of Children: N/A  . Years of Education: N/A   Occupational History  . 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 Smoking status: Never Smoker   . Smokeless tobacco: Never Used  . Alcohol Use: 0.0 oz/week    0 Standard drinks or equivalent per week     Comment: occasional  . Drug Use: No  . Sexual Activity: Not on file   Other Topics Concern  . Not on file   Social History Narrative   Lives with [place] in [place].   Active in her church.   Regular exercise:  Yes   RN at [employer]   Review of Systems  No rash No vomiting or diarrhea Appetite is off---only wants carbs     Objective:   Physical Exam  Constitutional: She appears well-developed and well-nourished. No distress.  HENT:  Mouth/Throat:  Oropharynx is clear and moist. No oropharyngeal exudate.  No sinus tenderness Moderate nasal inflammation Right TM retracted but not inflamed Left TM normal  Neck: Normal range of motion. Neck supple. No thyromegaly present.  Pulmonary/Chest: Effort normal and breath sounds normal. No respiratory distress. She has no wheezes. She has no rales.  Lymphadenopathy:    She has no cervical adenopathy.          Assessment & Plan:

## 2014-09-27 NOTE — Assessment & Plan Note (Signed)
With Eustachian tube dysfunction and right ear retraction--reason for visit May be bacterial but unsure She can continue flonase and ibuprofen If worsens, start amoxil

## 2014-10-02 NOTE — H&P (Signed)
L&D Evaluation:  History Expanded:  HPI 34 yo G2P1001, at 38 weeks, EDD of 10/27/12 per LMP, sent from office today for eval of elevated BPs. [facility] thus [facility]. Pt reports intermittant mild headache relieved by Tylenol and some some swelling after working 12 hours yesterday. No ctx, LOF, VB, visual changes.   Blood Type (Maternal) A positive   Group B Strep Results Maternal (Result >5wks must be treated as unknown) negative   Maternal HIV Negative   Maternal Syphilis Ab Nonreactive   Maternal Varicella Immune   Rubella Results (Maternal) immune   Maternal T-Dap Immune   Medications Pre Natal Vitamins   Allergies NKDA   Exam:  Vital Signs 137/71, 120/74 113/60   Urine Protein trace   General no apparent distress   Mental Status clear   Chest clear   Heart no murmur/gallop/rubs   Abdomen gravid, tender with contractions, occasional ctx   Edema trace   Reflexes 2+   Pelvic closed in office today   Mebranes Intact   FHT normal rate with no decels   Ucx irregular   Skin dry   Other Labs: PC Ratio: 211, Uric Acid 3.7, SGOT 30, Hgb 11.5, Hct 33.7, Plt 226   Impression:  Impression evaluation for PIH   Plan:  Plan discharge   Comments Pt has OB appt scheduled on 5/27, off work until next appt per Farrel Connersolleen Gutierrez, CNM. Pre-e precautions reviewed. Encouraged to rest as much as possible.   Electronic Signatures: Vella KohlerBrothers, Jarriel Papillion K (CNM)  (Signed 22-May-14 13:02)  Authored: L&D Evaluation   Last Updated: 22-May-14 13:02 by Vella KohlerBrothers, Nely Dedmon K (CNM)

## 2014-10-02 NOTE — H&P (Signed)
L&D Evaluation:  History Expanded:  HPI 34 yo G2P1 with induction scheduled for tomorrow who thinks she may be in labor. got tdap at 08/26/12.   Gravida 2   Term 1   PreTerm 0   Abortion 0   Living 1   Blood Type (Maternal) A positive   Group B Strep Results Maternal (Result >5wks must be treated as unknown) negative   Maternal HIV Negative   Maternal Syphilis Ab Nonreactive   Maternal Varicella Immune   Rubella Results (Maternal) immune   Maternal T-Dap Immune   [facility]   Presents with contractions   Patient's Medical History No Chronic Illness   Patient's Surgical History none   Medications Pre Natal Vitamins   Allergies NKDA   Social History none   Family History Non-Contributory   ROS:  ROS All systems were reviewed.  HEENT, CNS, GI, GU, Respiratory, CV, Renal and Musculoskeletal systems were found to be normal.   Exam:  Vital Signs stable   Urine Protein not completed   General no apparent distress, pasin with contractions   Mental Status clear   Chest clear   Heart normal sinus rhythm   Abdomen gravid, tender with contractions   Estimated Fetal Weight Average for gestational age   Fetal Position v   Back no CVAT   Edema no edema   Reflexes 1+   Pelvic no external lesions   Mebranes Intact   Description clear   FHT normal rate with no decels   Ucx regular   Skin dry   Impression:  Impression early labor   Plan:  Plan EFM/NST, monitor contractions and for cervical change   Follow Up Appointment need to schedule   Electronic Signatures: Adria DevonKlett, Briton Sellman (MD)  (Signed 10-Jun-14 10:29)  Authored: L&D Evaluation   Last Updated: 10-Jun-14 10:29 by Adria DevonKlett, Illias Pantano (MD)

## 2015-04-29 ENCOUNTER — Ambulatory Visit (INDEPENDENT_AMBULATORY_CARE_PROVIDER_SITE_OTHER)
Admission: RE | Admit: 2015-04-29 | Discharge: 2015-04-29 | Disposition: A | Discharge status: Home / Self Care | Payer: 59 | Source: Ambulatory Visit | Attending: Family Medicine | Admitting: Family Medicine

## 2015-04-29 ENCOUNTER — Ambulatory Visit (INDEPENDENT_AMBULATORY_CARE_PROVIDER_SITE_OTHER): Payer: 59 | Admitting: Family Medicine

## 2015-04-29 ENCOUNTER — Encounter: Payer: Self-pay | Admitting: Family Medicine

## 2015-04-29 VITALS — BP 130/72 | HR 87 | Temp 97.9°F | Wt 150.5 lb

## 2015-04-29 DIAGNOSIS — R109 Unspecified abdominal pain: Secondary | ICD-10-CM | POA: Diagnosis not present

## 2015-04-29 DIAGNOSIS — M546 Pain in thoracic spine: Secondary | ICD-10-CM

## 2015-04-29 DIAGNOSIS — R5383 Other fatigue: Secondary | ICD-10-CM | POA: Insufficient documentation

## 2015-04-29 LAB — CBC WITH DIFFERENTIAL/PLATELET
BASOS PCT: 0.6 % (ref 0.0–3.0)
Basophils Absolute: 0.1 10*3/uL (ref 0.0–0.1)
EOS PCT: 0.4 % (ref 0.0–5.0)
Eosinophils Absolute: 0 10*3/uL (ref 0.0–0.7)
HEMATOCRIT: 39.6 % (ref 36.0–46.0)
HEMOGLOBIN: 12.9 g/dL (ref 12.0–15.0)
LYMPHS PCT: 23.8 % (ref 12.0–46.0)
Lymphs Abs: 1.9 10*3/uL (ref 0.7–4.0)
MCHC: 32.6 g/dL (ref 30.0–36.0)
MCV: 92.6 fl (ref 78.0–100.0)
MONO ABS: 0.3 10*3/uL (ref 0.1–1.0)
Monocytes Relative: 3.9 % (ref 3.0–12.0)
Neutro Abs: 5.8 10*3/uL (ref 1.4–7.7)
Neutrophils Relative %: 71.3 % (ref 43.0–77.0)
Platelets: 273 10*3/uL (ref 150.0–400.0)
RBC: 4.28 Mil/uL (ref 3.87–5.11)
RDW: 12.3 % (ref 11.5–15.5)
WBC: 8.1 10*3/uL (ref 4.0–10.5)

## 2015-04-29 LAB — POCT URINALYSIS DIPSTICK
Bilirubin, UA: NEGATIVE
Blood, UA: NEGATIVE
GLUCOSE UA: NEGATIVE
Ketones, UA: NEGATIVE
LEUKOCYTES UA: NEGATIVE
NITRITE UA: NEGATIVE
PROTEIN UA: NEGATIVE
SPEC GRAV UA: 1.01
UROBILINOGEN UA: 0.2
pH, UA: 6

## 2015-04-29 LAB — TSH: TSH: 1.48 u[IU]/mL (ref 0.35–4.50)

## 2015-04-29 LAB — COMPREHENSIVE METABOLIC PANEL
ALBUMIN: 3.8 g/dL (ref 3.5–5.2)
ALK PHOS: 55 U/L (ref 39–117)
ALT: 30 U/L (ref 0–35)
AST: 21 U/L (ref 0–37)
BUN: 12 mg/dL (ref 6–23)
CALCIUM: 9 mg/dL (ref 8.4–10.5)
CO2: 24 mEq/L (ref 19–32)
CREATININE: 0.78 mg/dL (ref 0.40–1.20)
Chloride: 104 mEq/L (ref 96–112)
GFR: 89.23 mL/min (ref >60.00)
Glucose, Bld: 75 mg/dL (ref 70–99)
POTASSIUM: 4 meq/L (ref 3.5–5.1)
SODIUM: 137 meq/L (ref 135–145)
TOTAL PROTEIN: 6.9 g/dL (ref 6.0–8.3)
Total Bilirubin: 0.3 mg/dL (ref 0.2–1.2)

## 2015-04-29 LAB — LIPASE: LIPASE: 16 U/L (ref 11.0–59.0)

## 2015-04-29 NOTE — Assessment & Plan Note (Signed)
New- progressive.  Unclear etiology at this point.  ? Costochondritis  Since she did complain if increased urinary frequency- UA was ordered- neg.  This pain was not consistent with pyelo due to location. Will check labs, including those for fatigue- ? Fatigued because not sleeping as well due to pain. No rash- distribution could be consistent with zoster but I would expect a rash at this point. EKG did show some ST depression that was probably evident in previous EKG- ? Difficult to compare as she was in sinus tach in previous EKG.  Discussed with pt- consider stress testing but she would like results from today first which is reasonable.  Will get CXR, rib xray today in additional to labs.

## 2015-04-29 NOTE — Patient Instructions (Signed)

## 2015-04-29 NOTE — Progress Notes (Signed)
Pre visit review using our clinic review tool, if applicable. No additional management support is needed unless otherwise documented below in the visit note. 

## 2015-04-29 NOTE — Progress Notes (Signed)
Subjective:   Patient ID: Carla Griffin, female    DOB: 06/05/1980, 34 y.o.   MRN: [id number]  Carla Griffin is a pleasant 34 y.o. year old female who presents to clinic today with Flank Pain  on 04/29/2015  HPI:  Left upper side pain that can radiate around the front for a month. Getting progressively worse- can happen "all day now" Sometimes pain is as high as an 8/10. Has tried NSAIDs- nothing seems to make it better other than time. It has awoken her from sleep.  No known injury but she is moving and has a 35 year old that she often carries.  Some SOB with exertion- she is an [employer] fund[employer], very active.  No nausea or vomiting.  She feels she is more fatigued and has been urinating more frequently as well.  Current Outpatient Prescriptions on File Prior to Visit  Medication Sig Dispense Refill  . JUNEL FE 1.5/30 1.5-30 MG-MCG tablet Take 1 tablet by mouth daily.  6  . Multiple Vitamin (MULTIVITAMIN) tablet Take 1 tablet by mouth daily.    . ranitidine (ZANTAC) 150 MG capsule Take 150 mg by mouth 2 (two) times daily.     No current facility-administered medications on file prior to visit.    No Known Allergies  No past medical history on file.  No past surgical history on file.  Family History  Problem Relation Age of Onset  . Lupus Mother   . Hyperlipidemia Father   . Hypertension Father     Social History   Social History  . Marital Status: Married    Spouse Name: N/A  . Number of Children: N/A  . Years of Education: N/A   Occupational History  . 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 Smoking status: Never Smoker   . Smokeless tobacco: Never Used  . Alcohol Use: 0.0 oz/week    0 Standard drinks or equivalent per week     Comment: occasional  . Drug Use: No  . Sexual Activity: Not on file   Other Topics Concern  . Not on file   Social History Narrative   Lives with [place] in [place].   Active in her church.   Regular exercise:  Yes    RN at [employer]   The PMH, PSH, Social History, Family History, Medications, and allergies [facility], and have been updated if relevant.   Review of Systems  Constitutional: Positive for fatigue. Negative for fever.  Respiratory: Positive for shortness of breath.   Cardiovascular: Positive for chest pain. Negative for palpitations and leg swelling.  Gastrointestinal: Negative.   Endocrine: Positive for polyuria. Negative for polydipsia and polyphagia.  Genitourinary: Negative for urgency, decreased urine volume and difficulty urinating.  Musculoskeletal: Positive for back pain.  Allergic/Immunologic: Negative.   Neurological: Negative.   Psychiatric/Behavioral: Negative.   All other systems reviewed and are negative.      Objective:    BP 130/72 mmHg  Pulse 87  Temp(Src) 97.9 F (36.6 C) (Oral)  Wt 150 lb 8 oz (68.266 kg)  SpO2 99%  LMP 04/16/2015   Physical Exam  Constitutional: She is oriented to person, place, and time. She appears well-developed and well-nourished. No distress.  HENT:  Head: Normocephalic.  Eyes: Conjunctivae are normal.  Cardiovascular: Normal rate and regular rhythm.   Pulmonary/Chest: Effort normal.  TTP over left anterior rib cage, otherwise unremarkable  Abdominal: Soft. She exhibits  no distension. There is no tenderness. There is no rebound and no guarding.  Musculoskeletal: Normal range of motion.  Neurological: She is alert and oriented to person, place, and time. No cranial nerve deficit.  Skin: Skin is warm.  Psychiatric: She has a normal mood and affect. Her behavior is normal. Judgment and thought content normal.  Nursing note and vitals reviewed.         Assessment & Plan:   Flank pain - Plan: Urinalysis Dipstick No Follow-up on file.

## 2015-05-01 ENCOUNTER — Encounter: Payer: Self-pay | Admitting: *Deleted

## 2015-05-02 ENCOUNTER — Other Ambulatory Visit: Payer: Self-pay | Admitting: Family Medicine

## 2015-05-02 DIAGNOSIS — R0609 Other forms of dyspnea: Secondary | ICD-10-CM

## 2015-05-29 ENCOUNTER — Encounter: Payer: Self-pay | Admitting: Family Medicine

## 2015-05-30 ENCOUNTER — Encounter: Payer: Self-pay | Admitting: Internal Medicine

## 2015-05-30 ENCOUNTER — Ambulatory Visit (INDEPENDENT_AMBULATORY_CARE_PROVIDER_SITE_OTHER): Payer: BLUE CROSS/BLUE SHIELD | Admitting: Internal Medicine

## 2015-05-30 VITALS — BP 140/90 | HR 92 | Temp 98.5°F | Wt 149.0 lb

## 2015-05-30 DIAGNOSIS — R0789 Other chest pain: Secondary | ICD-10-CM

## 2015-05-30 DIAGNOSIS — R110 Nausea: Secondary | ICD-10-CM

## 2015-05-30 DIAGNOSIS — M546 Pain in thoracic spine: Secondary | ICD-10-CM | POA: Diagnosis not present

## 2015-05-30 MED ORDER — PANTOPRAZOLE SODIUM 40 MG PO TBEC: 40.0000 mg | DELAYED_RELEASE_TABLET | Freq: Every day | ORAL | Status: DC

## 2015-05-30 MED ORDER — PREDNISONE 10 MG PO TABS: ORAL_TABLET | ORAL | Status: DC

## 2015-05-30 NOTE — Progress Notes (Signed)
Subjective:    Patient ID: Carla Griffin, female    DOB: Oct 23, 1980, 35 y.o.   MRN: [id number]  HPI  Pt presents to the clinic today with c/o with ongoing left side chest pain. This started 5-6 weeks ago. The pain starts in the left side of her ribs. It radiates up and around her left breast. It can also radiate to the middle of her back. She describes the pain as sharp and stabbing at times. The pain can be sore and achy at other times. It does have a burning sensation at times. It does not seem be worse with certain movements. She does not have a rash.She does have some associated nausea but denies vomiting. She denies cough or shortness of breath. She denies abdominal pain. She does have intermittent diarrhea, but thinks this is possibly related to her choleycystectomy versus possible IBS. She has not seen a GI doctor. She has had reflux in the past and reports this doesn't exactly feel the same. She has tried Prilosec in the past but reports it didn't improve symptoms, only made her diarrhea worse. She has also has tried Zantac and Ibuprofen without any relief. She has no urinary or vaginal complaints. She did see Dr. Dayton Martes for the same 12/5. Urinalysis was normal. Chest xray and xray of left side of ribs were normal. All of her labs were normal. She did have EKG that showed some mild ST depression, but Dr. Dayton Martes felt like this was not something acute and pt declined referral to cardiology.  Review of Systems      History reviewed. No pertinent past medical history.  Current Outpatient Prescriptions  Medication Sig Dispense Refill  . JUNEL FE 1.5/30 1.5-30 MG-MCG tablet Take 1 tablet by mouth daily.  6  . Multiple Vitamin (MULTIVITAMIN) tablet Take 1 tablet by mouth daily.    . Probiotic Product (PRO-BIOTIC BLEND) CAPS Take 1 capsule by mouth daily.    . ranitidine (ZANTAC) 150 MG capsule Take 150 mg by mouth 2 (two) times daily.    . Simethicone (GAS-X ULTRA STRENGTH) 180 MG CAPS Take 2-4  capsules by mouth 2 (two) times daily.    . pantoprazole (PROTONIX) 40 MG tablet Take 1 tablet (40 mg total) by mouth daily. 30 tablet 0  . predniSONE (DELTASONE) 10 MG tablet Take 3 tabs on days 1-3, take 2 tabs on days 4-6, take 1 tab on days 7-9 18 tablet 0   No current facility-administered medications for this visit.    No Known Allergies  Family History  Problem Relation Age of Onset  . Lupus Mother   . Hyperlipidemia Father   . Hypertension Father     Social History   Social History  . Marital Status: Married    Spouse Name: N/A  . Number of Children: N/A  . Years of Education: N/A   Occupational History  . 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 Smoking status: Never Smoker   . Smokeless tobacco: Never Used  . Alcohol Use: 0.0 oz/week    0 Standard drinks or equivalent per week     Comment: occasional  . Drug Use: No  . Sexual Activity: Not on file   Other Topics Concern  . Not on file   Social History Narrative   Lives with husband in [place].   Active in her church.   Regular exercise:  Yes   RN at [employer]  Constitutional: Denies fever, malaise, fatigue, headache or abrupt weight changes.  Respiratory: Denies difficulty breathing, shortness of breath, cough or sputum production.   Cardiovascular: Denies chest pain, chest tightness, palpitations or swelling in the hands or feet.  Gastrointestinal: Pt reports diarrhea. Denies abdominal pain, bloating, constipation, or blood in the stool.  GU: Denies urgency, frequency, pain with urination, burning sensation, blood in urine, odor or discharge. Musculoskeletal: Pt reports chest wall pain. Denies decrease in range of motion, difficulty with gait, muscle pain or joint pain and swelling.  Skin: Denies redness, rashes, lesions or ulcercations.   No other specific complaints in a complete review of systems (except as listed in HPI above).  Objective:   Physical Exam   BP 140/90  mmHg  Pulse 92  Temp(Src) 98.5 F (36.9 C) (Oral)  Wt 149 lb (67.586 kg)  SpO2 99%  LMP 05/13/2015 Wt Readings from Last 3 Encounters:  05/30/15 149 lb (67.586 kg)  04/29/15 150 lb 8 oz (68.266 kg)  09/27/14 154 lb (69.854 kg)    General: Appears her stated age, well developed, well nourished in NAD. Skin: Warm, dry and intact. No rashes, lesions or ulcerations noted.  Cardiovascular: Normal rate and rhythm. S1,S2 noted.  No murmur, rubs or gallops noted.  Pulmonary/Chest: Normal effort and positive vesicular breath sounds. No respiratory distress. No wheezes, rales or ronchi noted.  Abdomen: Soft and nontender. Normal bowel sounds. No distention or masses noted. Liver, spleen and kidneys non palpable. Musculoskeletal: Pain not reproduced with palpation of the left chest wall. Neurological: Alert and oriented. Psychiatric: She does seem mildly anxious today.   BMET    Component Value Date/Time   NA 137 04/29/2015 1224   NA 136 03/17/2013 1041   K 4.0 04/29/2015 1224   K 3.4* 03/17/2013 1041   CL 104 04/29/2015 1224   CL 104 03/17/2013 1041   CO2 24 04/29/2015 1224   CO2 24 03/17/2013 1041   GLUCOSE 75 04/29/2015 1224   GLUCOSE 83 03/17/2013 1041   BUN 12 04/29/2015 1224   BUN 11 03/17/2013 1041   CREATININE 0.78 04/29/2015 1224   CREATININE 0.72 09/22/2013 1551   CREATININE 1.01 03/17/2013 1041   CALCIUM 9.0 04/29/2015 1224   CALCIUM 9.4 03/17/2013 1041   GFRNONAA >60 03/17/2013 1041   GFRNONAA 89.82 08/16/2009 0916   GFRAA >60 03/17/2013 1041    Lipid Panel     Component Value Date/Time   CHOL 171 08/16/2009 0916   TRIG 46.0 08/16/2009 0916   HDL 68.70 08/16/2009 0916   CHOLHDL 2 08/16/2009 0916   VLDL 9.2 08/16/2009 0916   LDLCALC 93 08/16/2009 0916    CBC    Component Value Date/Time   WBC 8.1 04/29/2015 1224   WBC 4.9 03/17/2013 1041   RBC 4.28 04/29/2015 1224   RBC 4.79 03/17/2013 1041   HGB 12.9 04/29/2015 1224   HGB 14.7 03/17/2013 1041   HCT  39.6 04/29/2015 1224   HCT 43.1 03/17/2013 1041   PLT 273.0 04/29/2015 1224   PLT 201 03/17/2013 1041   MCV 92.6 04/29/2015 1224   MCV 90 03/17/2013 1041   MCH 30.0 09/22/2013 1551   MCH 30.7 03/17/2013 1041   MCHC 32.6 04/29/2015 1224   MCHC 34.1 03/17/2013 1041   RDW 12.3 04/29/2015 1224   RDW 12.8 03/17/2013 1041   LYMPHSABS 1.9 04/29/2015 1224   LYMPHSABS 1.6 11/01/2012 1107   MONOABS 0.3 04/29/2015 1224   MONOABS 0.6 11/01/2012 1107   EOSABS 0.0  04/29/2015 1224   EOSABS 0.0 11/01/2012 1107   BASOSABS 0.1 04/29/2015 1224   BASOSABS 0.0 11/01/2012 1107    Hgb A1C No results found for: HGBA1C      Assessment & Plan:   Left chest wall pain, nausea:  Differentials include intercostal nerve irritation versus atypical reflux Advised her to stop Ibuprofen We will trial 14 day course of Protonix to see if this is reflux eRx for Pred Taper x 9 days to decrease any possible inflammation around a nerve If symptoms persist, consider thoracic xray, MRI of thoracic spine/chest and or a trial of Lyrica  She will update me in 10 days to let me know how she is feeling

## 2015-05-30 NOTE — Progress Notes (Signed)
Pre visit review using our clinic review tool, if applicable. No additional management support is needed unless otherwise documented below in the visit note. 

## 2015-05-30 NOTE — Patient Instructions (Signed)

## 2015-06-04 ENCOUNTER — Encounter: Payer: Self-pay | Admitting: Internal Medicine

## 2015-06-05 ENCOUNTER — Other Ambulatory Visit: Payer: Self-pay | Admitting: Internal Medicine

## 2015-06-05 MED ORDER — GABAPENTIN 100 MG PO CAPS: 100.0000 mg | ORAL_CAPSULE | Freq: Three times a day (TID) | ORAL | Status: DC

## 2015-06-06 ENCOUNTER — Ambulatory Visit: Payer: 59 | Admitting: Cardiology

## 2015-06-12 ENCOUNTER — Encounter: Payer: Self-pay | Admitting: Cardiology

## 2015-06-12 ENCOUNTER — Ambulatory Visit (INDEPENDENT_AMBULATORY_CARE_PROVIDER_SITE_OTHER): Payer: BLUE CROSS/BLUE SHIELD | Admitting: Cardiology

## 2015-06-12 VITALS — BP 128/60 | HR 104 | Ht 66.0 in | Wt 150.0 lb

## 2015-06-12 DIAGNOSIS — R002 Palpitations: Secondary | ICD-10-CM | POA: Diagnosis not present

## 2015-06-12 DIAGNOSIS — R0789 Other chest pain: Secondary | ICD-10-CM

## 2015-06-12 NOTE — Progress Notes (Signed)
Cardiology Office Note    Date:  06/12/2015   ID:  Carla Griffin, Carla Griffin Jan 29, 1981, MRN [id number]  PCP:  Arnette Norris, MD  Cardiologist:   Candee Furbish, MD     History of Present Illness:  Carla Griffin is a 35 y.o. female here for evaluation of chest pain at the request of Dr. Deborra Griffin.  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  Left-sided changes with certain movements such as leaning over with radiation to her upper left back, midsternal, tender to touch. Ibuprofen no relief. Now she is trying gabapentin. No rashes. Intermittent nausea sometimes at night associated with GERD and palpitations, diaphoresis. Occasionally will feel a sensation of warmth.  This began a few weeks ago mostly after a recent move, mostly on the left side of her ribs rating upper left breast. Sharp or stabbing. Sore, aching. She is frequently carrying her 65-year-old on her left side. Also has a 58-year-old child.  No rashes. No abdominal pain. Intermittent diarrhea. Possibly IBS. Chest x-ray normal. Urinalysis normal. EKG showed nonspecific ST-T wave changes.  More specifically, she describes dull ache since waking up, more intense through day. Noting more SOB with activity. Palpitations. Had palps quick run, short bursts of tachycardia.    Her symptoms do not change with food. GERD has been well treated with Protonix.  She is here with her husband today, Carla Griffin.  No past medical history on file.  Past Surgical History  Procedure Laterality Date  . Cholecystectomy  2014    Outpatient Prescriptions Prior to Visit  Medication Sig Dispense Refill  . gabapentin (NEURONTIN) 100 MG capsule Take 1 capsule (100 mg total) by mouth 3 (three) times daily. 90 capsule 0  . JUNEL FE 1.5/30 1.5-30 MG-MCG tablet Take 1 tablet by mouth daily.  6  . Multiple Vitamin (MULTIVITAMIN) tablet Take 1 tablet by mouth daily.    . pantoprazole (PROTONIX) 40 MG tablet Take 1 tablet (40 mg total) by mouth  daily. 30 tablet 0  . Probiotic Product (PRO-BIOTIC BLEND) CAPS Take 1 capsule by mouth daily.    . predniSONE (DELTASONE) 10 MG tablet Take 3 tabs on days 1-3, take 2 tabs on days 4-6, take 1 tab on days 7-9 (Patient not taking: Reported on 06/12/2015) 18 tablet 0  . ranitidine (ZANTAC) 150 MG capsule Take 150 mg by mouth 2 (two) times daily. Reported on 06/12/2015    . Simethicone (GAS-X ULTRA STRENGTH) 180 MG CAPS Take 2-4 capsules by mouth 2 (two) times daily. Reported on 06/12/2015     No facility-administered medications prior to visit.     Allergies:   Review of patient's allergies indicates no known allergies.   Social History   Social History  . Marital Status: Married    Spouse Name: N/A  . Number of Children: N/A  . Years of Education: N/A   Occupational History  . 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 Smoking status: Never Smoker   . Smokeless tobacco: Never Used  . Alcohol Use: 0.0 oz/week    0 Standard drinks or equivalent per week     Comment: occasional  . Drug Use: No  . Sexual Activity: Not Asked   Other Topics Concern  . None   Social History Narrative   Lives with husband in [place].   Active in her church.   Regular exercise:  Yes   RN at [employer]  Family History:  The patient's family history includes Hyperlipidemia in her father; Hypertension in her father; Lupus in her mother.   ROS:   Please see the history of present illness.    Review of Systems  Cardiovascular: Positive for palpitations.  Musculoskeletal: Positive for back pain.   All other systems reviewed and are negative.   PHYSICAL EXAM:   VS:  BP 128/60 mmHg  Pulse 104  Ht 5' 6"  (1.676 m)  Wt 150 lb (68.04 kg)  BMI 24.22 kg/m2  LMP 05/13/2015   GEN: Well nourished, well developed, in no acute distress HEENT: normal Neck: no JVD, carotid bruits, or masses Cardiac: RRR; no murmurs, rubs, or gallops,no edema  Respiratory:  clear to auscultation  bilaterally, normal work of breathing GI: soft, nontender, nondistended, + BS MS: no deformity or atrophy Skin: warm and dry, no rash Neuro:  Alert and Oriented x 3, Strength and sensation are intact Psych: euthymic mood, full affect  Wt Readings from Last 3 Encounters:  06/12/15 150 lb (68.04 kg)  05/30/15 149 lb (67.586 kg)  04/29/15 150 lb 8 oz (68.266 kg)      Studies/Labs Reviewed:   EKG:  EKG 04/29/15-nonspecific ST-T wave changes, sinus rhythm, personally viewed  Recent Labs: 04/29/2015: ALT 30; BUN 12; Creatinine, Ser 0.78; Hemoglobin 12.9; Platelets 273.0; Potassium 4.0; Sodium 137; TSH 1.48   Lipid Panel    Component Value Date/Time   CHOL 171 08/16/2009 0916   TRIG 46.0 08/16/2009 0916   HDL 68.70 08/16/2009 0916   CHOLHDL 2 08/16/2009 0916   VLDL 9.2 08/16/2009 0916   LDLCALC 93 08/16/2009 0916    Additional studies/ records that were reviewed today include:   A 5/16-chest x-ray-normal.    ASSESSMENT:    1. Atypical chest pain   2. Palpitation      PLAN:  In order of problems listed above:  1. Atypical chest pain-we will go ahead and proceed with echocardiogram to ensure proper structure and function of her heart. Based upon her description of symptoms, this does sound more musculoskeletal in etiology and perhaps may be a result of her recent move, heavy lifting, lifting of HER-30-year-old child mostly on her left side. Thankfully, EKG with nonspecific ST-T wave changes. Chest x-ray has been normal. Blood work unremarkable. NSAIDs did not seem to help. She is now trying gabapentin. There is been no rash development, i.e. shingles. Hopefully, if echocardiogram is normal, chest pain is noncardiac. 2. Palpitations-occasional palpitations sometimes occurring at night especially during nightmares. Short bursts. Based upon her description I do not think that she is having a reentrant tachycardia. If ejection fraction is normal on echocardiogram, reassuring. We will  go ahead and check an event monitor to make sure that she does not have any adverse arrhythmias. Could consider low-dose metoprolol to extinguish palpitations if absolutely necessary.    Medication Adjustments/Labs and Tests Ordered: Current medicines are reviewed at length with the patient today.  Concerns regarding medicines are outlined above.  Medication changes, Labs and Tests ordered today are listed in the Patient Instructions below. Patient Instructions  Medication Instructions:  The current medical regimen is effective;  continue present plan and medications.  Testing/Procedures: Your physician has requested that you have an echocardiogram. Echocardiography is a painless test that uses sound waves to create images of your heart. It provides your doctor with information about the size and shape of your heart and how well your heart's chambers and valves are working. This procedure takes approximately one  hour. There are no restrictions for this procedure.  Your physician has recommended that you wear an event monitor. Event monitors are medical devices that record the heart's electrical activity. Doctors most often [place] these monitors to diagnose arrhythmias. Arrhythmias are problems with the speed or rhythm of the heartbeat. The monitor is a small, portable device. You can wear one while you do your normal daily activities. This is usually used to diagnose what is causing palpitations/syncope (passing out).  Follow-Up: Follow up as needed after testing.  If you need a refill on your cardiac medications before your next appointment, please call your pharmacy.  Thank you for choosing [facility] !!             Signed, Candee Furbish, MD  06/12/2015 9:54 AM    [facility] [address], [place], [place]  [address] Phone: [phone]; Fax: [phone]

## 2015-06-12 NOTE — Patient Instructions (Signed)
Medication Instructions:  The current medical regimen is effective;  continue present plan and medications.  Testing/Procedures: Your physician has requested that you have an echocardiogram. Echocardiography is a painless test that uses sound waves to create images of your heart. It provides your doctor with information about the size and shape of your heart and how well your heart's chambers and valves are working. This procedure takes approximately one hour. There are no restrictions for this procedure.  Your physician has recommended that you wear an event monitor. Event monitors are medical devices that record the heart's electrical activity. Doctors most often us these monitors to diagnose arrhythmias. Arrhythmias are problems with the speed or rhythm of the heartbeat. The monitor is a small, portable device. You can wear one while you do your normal daily activities. This is usually used to diagnose what is causing palpitations/syncope (passing out).  Follow-Up: Follow up as needed after testing.  If you need a refill on your cardiac medications before your next appointment, please call your pharmacy.  Thank you for choosing [facility]!!      

## 2015-06-21 ENCOUNTER — Ambulatory Visit (HOSPITAL_COMMUNITY): Payer: BLUE CROSS/BLUE SHIELD | Attending: Internal Medicine

## 2015-06-21 ENCOUNTER — Other Ambulatory Visit: Payer: Self-pay

## 2015-06-21 ENCOUNTER — Ambulatory Visit (INDEPENDENT_AMBULATORY_CARE_PROVIDER_SITE_OTHER): Payer: BLUE CROSS/BLUE SHIELD

## 2015-06-21 DIAGNOSIS — R0789 Other chest pain: Secondary | ICD-10-CM

## 2015-06-21 DIAGNOSIS — R002 Palpitations: Secondary | ICD-10-CM | POA: Insufficient documentation

## 2015-06-21 DIAGNOSIS — R079 Chest pain, unspecified: Secondary | ICD-10-CM | POA: Diagnosis present

## 2015-06-24 ENCOUNTER — Other Ambulatory Visit: Payer: Self-pay | Admitting: Internal Medicine

## 2015-06-24 ENCOUNTER — Encounter: Payer: Self-pay | Admitting: Internal Medicine

## 2015-06-24 MED ORDER — PANTOPRAZOLE SODIUM 40 MG PO TBEC: 40.0000 mg | DELAYED_RELEASE_TABLET | Freq: Every day | ORAL | Status: DC

## 2015-07-01 ENCOUNTER — Other Ambulatory Visit: Payer: Self-pay | Admitting: Internal Medicine

## 2015-07-01 NOTE — Telephone Encounter (Signed)
Last filled 06/05/15 by Sherley Bounds advise if okay to refill

## 2015-07-27 ENCOUNTER — Other Ambulatory Visit: Payer: Self-pay | Admitting: Family Medicine

## 2015-07-29 NOTE — Telephone Encounter (Signed)
Pt has only seen Dr Dayton MartesAron once twice 2013-acute only

## 2015-08-01 ENCOUNTER — Telehealth: Payer: Self-pay | Admitting: Cardiology

## 2015-08-01 NOTE — Telephone Encounter (Signed)
Follow Up ° °Pt returned call//  °

## 2015-08-01 NOTE — Telephone Encounter (Signed)
**Note De-Identified Carla Griffin Obfuscation** The pt has been given her Event monitor results and she verbalized understanding.

## 2015-08-07 ENCOUNTER — Ambulatory Visit: Payer: BLUE CROSS/BLUE SHIELD | Admitting: Family Medicine

## 2015-08-14 ENCOUNTER — Encounter: Payer: Self-pay | Admitting: Family Medicine

## 2015-08-14 ENCOUNTER — Ambulatory Visit (INDEPENDENT_AMBULATORY_CARE_PROVIDER_SITE_OTHER): Payer: BLUE CROSS/BLUE SHIELD | Admitting: Family Medicine

## 2015-08-14 VITALS — BP 128/78 | HR 100 | Temp 98.0°F | Wt 148.8 lb

## 2015-08-14 DIAGNOSIS — R002 Palpitations: Secondary | ICD-10-CM | POA: Diagnosis not present

## 2015-08-14 DIAGNOSIS — K219 Gastro-esophageal reflux disease without esophagitis: Secondary | ICD-10-CM | POA: Diagnosis not present

## 2015-08-14 DIAGNOSIS — M546 Pain in thoracic spine: Secondary | ICD-10-CM | POA: Diagnosis not present

## 2015-08-14 MED ORDER — PANTOPRAZOLE SODIUM 40 MG PO TBEC: 40.0000 mg | DELAYED_RELEASE_TABLET | Freq: Every day | ORAL | Status: DC

## 2015-08-14 NOTE — Assessment & Plan Note (Signed)
Intermittent, followed by cardiology.  Recent Holter. Not currently taking rx for it.

## 2015-08-14 NOTE — Progress Notes (Signed)
Subjective:   Patient ID: Carla Griffin, female    DOB: Sep 06, 1980, 35 y.o.   MRN: [id number]  Carla Griffin is a pleasant 35 y.o. year old female who presents to clinic today with Left side pain  on 08/14/2015  HPI: Follow up left side pain-  I originally saw Carla Griffin for this complaint on 36/5/16- note reviewed. Seemed likely MSK in nature but did discuss cardiac work up if symptoms persisted. Less likely zoster. Advised NSAIDs. CXR and blood work were unremarkable.  She returned to clinic on 05/30/15 with recurrent symptoms and saw my partner, Carla Griffin. Note reviewed. ? Reflux.  Advised to stop NSAIDs, and started prednisone and protonix.  Symptoms persisted, so she was referred to cardiology.  Saw. Dr. Anne Griffin on 06/12/15. Note reviewed. Echo and holter monitor- both reassuring.   Reflux symptoms improved with protonix but having persistent, sharp left thoracic pain - can be a 7/10.  Carla Griffin does think this is getting worse.  Nothing seems to make it better or worse.  Current Outpatient Prescriptions on File Prior to Visit  Medication Sig Dispense Refill  . gabapentin (NEURONTIN) 100 MG capsule TAKE 1 CAPSULE (100 MG TOTAL) BY MOUTH 3 (THREE) TIMES DAILY. 90 capsule 0  . JUNEL FE 1.5/30 1.5-30 MG-MCG tablet Take 1 tablet by mouth daily.  6  . Multiple Vitamin (MULTIVITAMIN) tablet Take 1 tablet by mouth daily.     No current facility-administered medications on file prior to visit.    No Known Allergies  No past medical history on file.  Past Surgical History  Procedure Laterality Date  . Cholecystectomy  2014    Family History  Problem Relation Age of Onset  . Lupus Mother   . Hyperlipidemia Father   . Hypertension Father     Social History   Social History  . Marital Status: Married    Spouse Name: N/A  . Number of Children: N/A  . Years of Education: N/A   Occupational History  . 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 Smoking  status: Never Smoker   . Smokeless tobacco: Never Used  . Alcohol Use: 0.0 oz/week    0 Standard drinks or equivalent per week     Comment: occasional  . Drug Use: No  . Sexual Activity: Not on file   Other Topics Concern  . Not on file   Social History Narrative   Lives with husband in [place].   Active in her church.   Regular exercise:  Yes   RN at [employer]   The PMH, PSH, Social History, Family History, Medications, and allergies have been reviewed in [facility], and have been updated if relevant.     Review of Systems  Constitutional: Negative.   HENT: Negative.   Respiratory: Negative.   Cardiovascular: Negative.   Gastrointestinal: Negative.  Negative for abdominal distention.  Genitourinary: Negative.   Musculoskeletal:       Left thoracic pain  Skin: Negative.   Neurological: Negative.   Psychiatric/Behavioral: Negative.   All other systems reviewed and are negative.      Objective:    BP 128/78 mmHg  Pulse 100  Temp(Src) 98 F (36.7 C) (Oral)  Wt 148 lb 12 oz (67.473 kg)  SpO2 99%   Physical Exam  Constitutional: She is oriented to person, place, and time. She appears well-developed and well-nourished. No distress.  HENT:  Head: Normocephalic.  Eyes: Conjunctivae are normal.  Cardiovascular: Normal rate.   Pulmonary/Chest: Effort normal. She exhibits no tenderness.  Musculoskeletal: Normal range of motion.  Neurological: She is alert and oriented to person, place, and time.  Skin: Skin is warm and dry. She is not diaphoretic.  Psychiatric: She has a normal mood and affect. Her behavior is normal. Judgment and thought content normal.  Nursing note and vitals reviewed.         Assessment & Plan:   Left-sided thoracic back pain - Plan: CT Chest W Contrast  Palpitations  Gastroesophageal reflux disease, esophagitis presence not specified No Follow-up on file.

## 2015-08-14 NOTE — Assessment & Plan Note (Signed)
Deteriorated and etiology remains unclear with so far negative work up. CT of chest for further evaluation. The patient indicates understanding of these issues and agrees with the plan.

## 2015-08-14 NOTE — Patient Instructions (Signed)
We will call you with your CT scan appointment.

## 2015-08-14 NOTE — Progress Notes (Signed)
Pre visit review using our clinic review tool, if applicable. No additional management support is needed unless otherwise documented below in the visit note. 

## 2015-08-14 NOTE — Assessment & Plan Note (Signed)
Improved with protonix. eRx refilled.

## 2015-08-15 ENCOUNTER — Ambulatory Visit (INDEPENDENT_AMBULATORY_CARE_PROVIDER_SITE_OTHER): Payer: BLUE CROSS/BLUE SHIELD

## 2015-08-15 DIAGNOSIS — K760 Fatty (change of) liver, not elsewhere classified: Secondary | ICD-10-CM | POA: Diagnosis not present

## 2015-08-15 DIAGNOSIS — R911 Solitary pulmonary nodule: Secondary | ICD-10-CM

## 2015-08-15 DIAGNOSIS — M546 Pain in thoracic spine: Secondary | ICD-10-CM

## 2015-08-15 MED ORDER — IOPAMIDOL (ISOVUE-300) INJECTION 61%
75.0000 mL | Freq: Once | INTRAVENOUS | Status: AC | PRN
Start: 2015-08-15 — End: 2015-08-15
  Administered 2015-08-15: 75 mL via INTRAVENOUS

## 2015-08-16 ENCOUNTER — Encounter: Payer: Self-pay | Admitting: Family Medicine

## 2015-12-12 ENCOUNTER — Other Ambulatory Visit: Payer: Self-pay | Admitting: Family Medicine

## 2016-01-07 ENCOUNTER — Encounter: Payer: Self-pay | Admitting: Gastroenterology

## 2016-01-07 ENCOUNTER — Ambulatory Visit (INDEPENDENT_AMBULATORY_CARE_PROVIDER_SITE_OTHER): Payer: BLUE CROSS/BLUE SHIELD | Admitting: Osteopathic Medicine

## 2016-01-07 ENCOUNTER — Encounter: Payer: Self-pay | Admitting: Osteopathic Medicine

## 2016-01-07 VITALS — BP 140/80 | HR 82 | Ht 66.0 in | Wt 142.0 lb

## 2016-01-07 DIAGNOSIS — R110 Nausea: Secondary | ICD-10-CM | POA: Diagnosis not present

## 2016-01-07 DIAGNOSIS — R1084 Generalized abdominal pain: Secondary | ICD-10-CM

## 2016-01-07 DIAGNOSIS — R6881 Early satiety: Secondary | ICD-10-CM

## 2016-01-07 DIAGNOSIS — R195 Other fecal abnormalities: Secondary | ICD-10-CM | POA: Diagnosis not present

## 2016-01-07 DIAGNOSIS — R1310 Dysphagia, unspecified: Secondary | ICD-10-CM

## 2016-01-07 DIAGNOSIS — R109 Unspecified abdominal pain: Secondary | ICD-10-CM

## 2016-01-07 DIAGNOSIS — R7401 Elevation of levels of liver transaminase levels: Secondary | ICD-10-CM

## 2016-01-07 DIAGNOSIS — R740 Nonspecific elevation of levels of transaminase and lactic acid dehydrogenase [LDH]: Secondary | ICD-10-CM

## 2016-01-07 LAB — CBC WITH DIFFERENTIAL/PLATELET
BASOS ABS: 55 {cells}/uL (ref 0–200)
BASOS PCT: 1 %
EOS PCT: 1 %
Eosinophils Absolute: 55 cells/uL (ref 15–500)
HCT: 38.9 % (ref 35.0–45.0)
HEMOGLOBIN: 13.2 g/dL (ref 11.7–15.5)
Lymphs Abs: 1540 cells/uL (ref 850–3900)
MCH: 31 pg (ref 27.0–33.0)
MCHC: 33.9 g/dL (ref 32.0–36.0)
MCV: 91.3 fL (ref 80.0–100.0)
MPV: 9.6 fL (ref 7.5–12.5)
Monocytes Absolute: 330 cells/uL (ref 200–950)
Monocytes Relative: 6 %
NEUTROS ABS: 3520 {cells}/uL (ref 1500–7800)
Neutrophils Relative %: 64 %
Platelets: 247 10*3/uL (ref 140–400)
RBC: 4.26 MIL/uL (ref 3.80–5.10)
RDW: 12.7 % (ref 11.0–15.0)
WBC: 5.5 10*3/uL (ref 3.8–10.8)

## 2016-01-07 LAB — COMPLETE METABOLIC PANEL WITH GFR
ALBUMIN: 4.1 g/dL (ref 3.6–5.1)
ALK PHOS: 61 U/L (ref 33–115)
ALT: 67 U/L — ABNORMAL HIGH (ref 6–29)
AST: 26 U/L (ref 10–30)
BUN: 12 mg/dL (ref 7–25)
CO2: 26 mmol/L (ref 20–31)
Calcium: 9.1 mg/dL (ref 8.6–10.2)
Chloride: 105 mmol/L (ref 98–110)
Creat: 0.82 mg/dL (ref 0.50–1.10)
GFR, Est African American: >89 mL/min (ref >=60)
GFR, Est Non African American: >89 mL/min (ref >=60)
GLUCOSE: 92 mg/dL (ref 65–99)
POTASSIUM: 5.1 mmol/L (ref 3.5–5.3)
SODIUM: 139 mmol/L (ref 135–146)
Total Bilirubin: 0.3 mg/dL (ref 0.2–1.2)
Total Protein: 6.5 g/dL (ref 6.1–8.1)

## 2016-01-07 LAB — LIPASE: Lipase: 15 U/L (ref 7–60)

## 2016-01-07 LAB — POCT URINE PREGNANCY: Preg Test, Ur: NEGATIVE

## 2016-01-07 LAB — TSH: TSH: 1.66 mIU/L

## 2016-01-07 MED ORDER — ONDANSETRON 8 MG PO TBDP: 8.0000 mg | ORAL_TABLET | Freq: Three times a day (TID) | ORAL | 1 refills | Status: DC | PRN

## 2016-01-07 MED ORDER — DICYCLOMINE HCL 20 MG PO TABS: 20.0000 mg | ORAL_TABLET | Freq: Three times a day (TID) | ORAL | 0 refills | Status: DC

## 2016-01-07 NOTE — Progress Notes (Signed)
HPI: Carla Griffin is a 35 y.o. female  who presents to [facility] today, 01/07/16,  for chief complaint of:  Chief Complaint  Patient presents with  . Establish Care    Left side pain and Epigastric pain    New patient here to establish care and address "continued left-sided pain and increased epigastric pain.   Records reviewed:   Most recently seen by Dr. Deborra Medina at [facility] about 5 months ago (07/2015) for similar symptoms of left-sided pain, originally evaluated in December 2016, considered musculoskeletal, advised NSAIDs, CXR and blood work were fine. Return to clinic one month after that, diagnoses possible reflux, started prednisone and Protonix and stopped NSAIDs.   Symptoms persisted and the patient was referred to cardiology 06/12/2015. That note was also reviewed, patient was complaining of left-sided pains with certain movements, tender to touch midsternal, intermittent nausea associated with GERD, EKG showed nonspecific ST-T wave changes, sinus rhythm. diaphoresis. Echocardiogram and Holter monitor were normal - occasional PVCs and brief paroxysmal atrial tachycardia noted as benign.   At last visit with Dr. Deborra Medina, CT of the chest was ordered, no changes made to medications. CT results 08/15/2015  showed incidental 3 mm nodular opacity inferior lingula with no follow-up needed no low risk patient. Hepatic steatosis, study otherwise unremarkable  Current symptoms . Context: see above . Location: L anterior ribs, beneath breast . Quality: sharpness pain, crampy on L and occasionally throughout the abdomen . Duration: almost a year . Modifying factors: Patient currently on Protonix 40 mg daily, probiotic, over-the-counter antacid/anti-gas chewables - calcium plus simethicone. Previously has been on NSAIDs, prednisone burst, gabapentin, Zantac . Assoc signs/symptoms: GERD/indigestion since 2014 w/ birth of last child. Protonix was helpful  until recently, more belching. Loose stool since GB out a bit worse but also had this problem prior to surgery. Notes occasional feeling of something getting stuck in the throat/early satiety/swallowing difficulty.     Past medical, surgical, social and family history reviewed: No past medical history on file. Past Surgical History:  Procedure Laterality Date  . CHOLECYSTECTOMY  2014   Social History  Substance Use Topics  . Smoking status: Never Smoker  . Smokeless tobacco: Never Used  . Alcohol use 0.0 oz/week     Comment: occasional   Family History  Problem Relation Age of Onset  . Lupus Mother   . Hyperlipidemia Father   . Hypertension Father      Current medication list and allergy/intolerance information reviewed:   Current Outpatient Prescriptions  Medication Sig Dispense Refill  . JUNEL FE 1.5/30 1.5-30 MG-MCG tablet Take 1 tablet by mouth daily.  6  . Multiple Vitamin (MULTIVITAMIN) tablet Take 1 tablet by mouth daily.    . pantoprazole (PROTONIX) 40 MG tablet TAKE 1 TABLET (40 MG TOTAL) BY MOUTH DAILY. 90 tablet 1  . Probiotic Product (ALIGN) 4 MG CAPS Take by mouth daily. Take 1 capsule daily.     No current facility-administered medications for this visit.    No Known Allergies    Review of Systems:  Constitutional:  No  fever, no chills, (+) sweats, No recent illness, No unintentional weight changes. No significant fatigue.   HEENT: (+)occasional headache, no vision change, no hearing change, No sore throat, No  sinus pressure  Cardiac: No  chest pain, (+) chest discomfort as per HPI, No  pressure, No palpitations, No  Orthopnea  Respiratory:  No  shortness of breath. No  Cough  Gastrointestinal:  No  abdominal pain, (+) nausea, No  vomiting,  No  blood in stool, No  diarrhea, No  constipation   Musculoskeletal: No new myalgia/arthralgia  Genitourinary: No  incontinence, No  abnormal genital bleeding, No abnormal genital discharge  Skin: No  Rash, No  other wounds/concerning lesions  Hem/Onc: No  easy bruising/bleeding, No  abnormal lymph node  Endocrine: No cold intolerance,  No heat intolerance. No polyuria/polydipsia/polyphagia   Neurologic: No  weakness, No  dizziness, No  slurred speech/focal weakness/facial droop  Psychiatric: No  concerns with depression, No  concerns with anxiety, No sleep problems, No mood problems  Exam:  BP 140/80   Pulse 82   Ht '5\' 6"'$  (1.676 m)   Wt 142 lb (64.4 kg)   BMI 22.92 kg/m   Constitutional: VS see above. General Appearance: alert, well-developed, well-nourished, NAD  Eyes: Normal lids and conjunctive, non-icteric sclera  Ears, Nose, Mouth, Throat: MMM, Normal external inspection ears/nares/mouth/lips/gums.   Neck: No masses, trachea midline. No thyroid enlargement. No tenderness/mass appreciated. No lymphadenopathy  Respiratory: Normal respiratory effort. no wheeze, no rhonchi, no rales  Cardiovascular: S1/S2 normal, no murmur, no rub/gallop auscultated. RRR. No lower extremity edema  Gastrointestinal: Nontender, no masses. No hepatomegaly, no splenomegaly. No hernia appreciated. Bowel sounds normal. Rectal exam deferred.   Musculoskeletal: Gait normal. No clubbing/cyanosis of digits.   Neurological:  Normal balance/coordination. No tremor.   Skin: warm, dry, intact. No rash/ulcer. Marland Kitchen    Psychiatric: Normal judgment/insight. Normal mood and affect. Oriented x3.    Results for orders placed or performed in visit on 01/07/16 (from the past 72 hour(s))  POCT urine pregnancy     Status: None   Collection Time: 01/07/16 11:18 AM  Result Value Ref Range   Preg Test, Ur Negative Negative  CBC with Differential/Platelet     Status: None   Collection Time: 01/07/16 11:38 AM  Result Value Ref Range   WBC 5.5 3.8 - 10.8 K/uL   RBC 4.26 3.80 - 5.10 MIL/uL   Hemoglobin 13.2 11.7 - 15.5 g/dL   HCT 38.9 35.0 - 45.0 %   MCV 91.3 80.0 - 100.0 fL   MCH 31.0 27.0 - 33.0 pg   MCHC 33.9 32.0  - 36.0 g/dL   RDW 12.7 11.0 - 15.0 %   Platelets 247 140 - 400 K/uL   MPV 9.6 7.5 - 12.5 fL   Neutro Abs 3,520 1,500 - 7,800 cells/uL   Lymphs Abs 1,540 850 - 3,900 cells/uL   Monocytes Absolute 330 200 - 950 cells/uL   Eosinophils Absolute 55 15 - 500 cells/uL   Basophils Absolute 55 0 - 200 cells/uL   Neutrophils Relative % 64 %   Monocytes Relative 6 %   Eosinophils Relative 1 %   Basophils Relative 1 %   Smear Review Criteria for review not met     Comment: ** Please note change in unit of measure and reference range(s). **  COMPLETE METABOLIC PANEL WITH GFR     Status: Abnormal   Collection Time: 01/07/16 11:38 AM  Result Value Ref Range   Sodium 139 135 - 146 mmol/L   Potassium 5.1 3.5 - 5.3 mmol/L   Chloride 105 98 - 110 mmol/L   CO2 26 20 - 31 mmol/L   Glucose, Bld 92 65 - 99 mg/dL   BUN 12 7 - 25 mg/dL   Creat 0.82 0.50 - 1.10 mg/dL   Total Bilirubin 0.3 0.2 - 1.2 mg/dL   Alkaline Phosphatase  61 33 - 115 U/L   AST 26 10 - 30 U/L   ALT 67 (H) 6 - 29 U/L   Total Protein 6.5 6.1 - 8.1 g/dL   Albumin 4.1 3.6 - 5.1 g/dL   Calcium 9.1 8.6 - 10.2 mg/dL   GFR, Est African American >89 >=60 mL/min   GFR, Est Non African American >89 >=60 mL/min  TSH     Status: None   Collection Time: 01/07/16 11:38 AM  Result Value Ref Range   TSH 1.66 mIU/L    Comment:   Reference Range   > or = 20 Years  0.40-4.50   Pregnancy Range First trimester  0.26-2.66 Second trimester 0.55-2.73 Third trimester  0.43-2.91     Lipase     Status: None   Collection Time: 01/07/16 11:38 AM  Result Value Ref Range   Lipase 15 7 - 60 U/L      ASSESSMENT/PLAN:   Symptoms seem more GI in nature, esp given negative cardiac w/u, possible MSK pain referral pattern from stomach/esophagus can explain the back pain. At this point, I think referral to GI to consider scope as evaluation for esophageal problem or hiatal hernia would be reasonable.   IBS may be a consideration but would try fatty  food elimination/reduction first given s/p cholecystectomy. Can try Bentyl and Zofran for symptomatic treatment in addition to dietary modifications.   ALT elevated - plan to repeat this and if still high consider further w/u, AST is WNL  Left sided abdominal pain - Plan: Ambulatory referral to Gastroenterology, COMPLETE METABOLIC PANEL WITH GFR, Lipase  Nausea without vomiting - Plan: ondansetron (ZOFRAN-ODT) 8 MG disintegrating tablet, Ambulatory referral to Gastroenterology, POCT urine pregnancy  Loose stools - Plan: Ambulatory referral to Gastroenterology, CBC with Differential/Platelet, COMPLETE METABOLIC PANEL WITH GFR, TSH  Generalized abdominal cramps - Plan: Ambulatory referral to Gastroenterology  Early satiety - Plan: Ambulatory referral to Gastroenterology  Swallowing difficulty - Plan: Ambulatory referral to Gastroenterology     Patient Instructions  Plan: #1 - labs today to rule out unusual causes/complications #2 - dietary changes   see attached for foods to avoid with IBS  Avoid fatty foods since your gallbladder has been removed  let me know if you'd like dietician referral #3 - medications  Bentyl/Dicyslomine for cramps  Zofran/Ondansetron as needed for nausea #4 - referral to GI - they may consider scope or other workup    Visit summary with medication list and pertinent instructions was printed for patient to review. All questions at time of visit were answered - patient instructed to contact office with any additional concerns. ER/RTC precautions were reviewed with the patient. Follow-up plan: Return if symptoms worsen or fail to improve after seen by GI, and at your convenience for annual physical .  Note: Total time spent 45 minutes, greater than 50% of the visit was spent face-to-face counseling and coordinating care for the following: The primary encounter diagnosis was Left sided abdominal pain. Diagnoses of Nausea without vomiting, Loose stools,  Generalized abdominal cramps, Early satiety, Swallowing difficulty, and Elevated ALT measurement were also pertinent to this visit.Marland Kitchen

## 2016-01-07 NOTE — Patient Instructions (Addendum)
Plan: #1 - labs today to rule out unusual causes/complications #2 - dietary changes   see attached for foods to avoid with IBS  Avoid fatty foods since your gallbladder has been removed  let me know if you'd like dietician referral #3 - medications  Bentyl/Dicyslomine for cramps  Zofran/Ondansetron as needed for nausea #4 - referral to GI - they may consider scope or other workup

## 2016-01-08 DIAGNOSIS — R740 Nonspecific elevation of levels of transaminase and lactic acid dehydrogenase [LDH]: Secondary | ICD-10-CM

## 2016-01-08 DIAGNOSIS — R7401 Elevation of levels of liver transaminase levels: Secondary | ICD-10-CM | POA: Insufficient documentation

## 2016-01-08 DIAGNOSIS — R1084 Generalized abdominal pain: Secondary | ICD-10-CM | POA: Insufficient documentation

## 2016-01-08 DIAGNOSIS — R110 Nausea: Secondary | ICD-10-CM | POA: Insufficient documentation

## 2016-01-08 DIAGNOSIS — R109 Unspecified abdominal pain: Secondary | ICD-10-CM | POA: Insufficient documentation

## 2016-01-08 DIAGNOSIS — R6881 Early satiety: Secondary | ICD-10-CM | POA: Insufficient documentation

## 2016-01-08 DIAGNOSIS — R195 Other fecal abnormalities: Secondary | ICD-10-CM | POA: Insufficient documentation

## 2016-01-08 DIAGNOSIS — R1310 Dysphagia, unspecified: Secondary | ICD-10-CM | POA: Insufficient documentation

## 2016-01-09 ENCOUNTER — Other Ambulatory Visit: Payer: Self-pay

## 2016-01-09 DIAGNOSIS — R748 Abnormal levels of other serum enzymes: Secondary | ICD-10-CM

## 2016-01-17 DIAGNOSIS — R748 Abnormal levels of other serum enzymes: Secondary | ICD-10-CM | POA: Diagnosis not present

## 2016-01-18 LAB — COMPLETE METABOLIC PANEL WITH GFR
ALT: 38 U/L — AB (ref 6–29)
AST: 28 U/L (ref 10–30)
Albumin: 4 g/dL (ref 3.6–5.1)
Alkaline Phosphatase: 55 U/L (ref 33–115)
BILIRUBIN TOTAL: 0.3 mg/dL (ref 0.2–1.2)
BUN: 15 mg/dL (ref 7–25)
CALCIUM: 9.3 mg/dL (ref 8.6–10.2)
CHLORIDE: 104 mmol/L (ref 98–110)
CO2: 23 mmol/L (ref 20–31)
CREATININE: 0.9 mg/dL (ref 0.50–1.10)
GFR, Est Non African American: 83 mL/min (ref >=60)
Glucose, Bld: 102 mg/dL — ABNORMAL HIGH (ref 65–99)
Potassium: 5 mmol/L (ref 3.5–5.3)
Sodium: 139 mmol/L (ref 135–146)
TOTAL PROTEIN: 6.4 g/dL (ref 6.1–8.1)

## 2016-02-03 ENCOUNTER — Other Ambulatory Visit: Payer: Self-pay | Admitting: Osteopathic Medicine

## 2016-02-18 ENCOUNTER — Other Ambulatory Visit (INDEPENDENT_AMBULATORY_CARE_PROVIDER_SITE_OTHER): Payer: BLUE CROSS/BLUE SHIELD

## 2016-02-18 ENCOUNTER — Ambulatory Visit (INDEPENDENT_AMBULATORY_CARE_PROVIDER_SITE_OTHER): Payer: BLUE CROSS/BLUE SHIELD | Admitting: Gastroenterology

## 2016-02-18 ENCOUNTER — Encounter: Payer: Self-pay | Admitting: Gastroenterology

## 2016-02-18 VITALS — BP 122/80 | HR 82 | Ht 66.0 in | Wt 140.0 lb

## 2016-02-18 DIAGNOSIS — R1013 Epigastric pain: Secondary | ICD-10-CM

## 2016-02-18 DIAGNOSIS — R197 Diarrhea, unspecified: Secondary | ICD-10-CM

## 2016-02-18 DIAGNOSIS — K760 Fatty (change of) liver, not elsewhere classified: Secondary | ICD-10-CM

## 2016-02-18 DIAGNOSIS — R7989 Other specified abnormal findings of blood chemistry: Secondary | ICD-10-CM

## 2016-02-18 DIAGNOSIS — R1012 Left upper quadrant pain: Secondary | ICD-10-CM | POA: Diagnosis not present

## 2016-02-18 DIAGNOSIS — R945 Abnormal results of liver function studies: Secondary | ICD-10-CM

## 2016-02-18 LAB — HEPATIC FUNCTION PANEL
ALBUMIN: 3.9 g/dL (ref 3.5–5.2)
ALT: 67 U/L — ABNORMAL HIGH (ref 0–35)
AST: 33 U/L (ref 0–37)
Alkaline Phosphatase: 54 U/L (ref 39–117)
Bilirubin, Direct: 0.1 mg/dL (ref 0.0–0.3)
Total Bilirubin: 0.4 mg/dL (ref 0.2–1.2)
Total Protein: 7.1 g/dL (ref 6.0–8.3)

## 2016-02-18 LAB — IGA: IGA: 172 mg/dL (ref 68–378)

## 2016-02-18 NOTE — Progress Notes (Signed)
Carla Griffin    [id number]    1981-05-05  Primary Care Physician:Natalie Lyn Hollingshead, DO  Referring Physician: [name], DO [address] [place], [place] [address]  Chief complaint: LUQ abd pain, Epigastric pain, heartburn, excessive belching  HPI: 35 year old female referred by PMD with complaints of dyspepsia, indigestion and left upper quadrant pain for past 2-3 years, getting progressively worse. Certain foods make her symptoms worse like milk and beef with high fat content. She also has excessive belching and if she is unable to belch feels like something in stuck in her chest that improves at times with TUMS/GasX. No improvement with Protonix daily. She underwent cholecystectomy in 2014 for symptomatic gallstones/cholecystitis. Denies any dysphagia, odynophagia, nausea, vomiting, melena or bright red blood per rectum. She has intermittent loose stool, worse when she drinks coffee with milk or cream.    She has been waking up most night drenched with night sweats for past 2-3 years. Work as Charity fundraiser at [employer] , has been getting annual TB testing that's always negative. She takes NSAID's on regular basis about 2-3 times per week for low back pain and b/l hip pain since she was a teenager. Her mother has Lupus and she has seen a rheumatologist many years ago but doesn't recall if any work up was done   Outpatient Encounter Prescriptions as of 02/18/2016  Medication Sig  . JUNEL FE 1.5/30 1.5-30 MG-MCG tablet Take 1 tablet by mouth daily.  . Multiple Vitamin (MULTIVITAMIN) tablet Take 1 tablet by mouth daily.  . ondansetron (ZOFRAN-ODT) 8 MG disintegrating tablet Take 1 tablet (8 mg total) by mouth every 8 (eight) hours as needed for nausea or vomiting.  . pantoprazole (PROTONIX) 40 MG tablet TAKE 1 TABLET (40 MG TOTAL) BY MOUTH DAILY.  . Probiotic Product (ALIGN) 4 MG CAPS Take by mouth daily. Take 1 capsule daily.  Marland Kitchen dicyclomine (BENTYL) 20  MG tablet TAKE 1 TABLET BY MOUTH 4 TIMES A DAY BEFORE MEALS AND AT BED TIME (Patient not taking: Reported on 02/18/2016)   No facility-administered encounter medications on file as of 02/18/2016.     Allergies as of 02/18/2016  . (No Known Allergies)    Past Medical History:  Diagnosis Date  . Gallstones   . GERD (gastroesophageal reflux disease)   . IBS (irritable bowel syndrome)   . Pneumonia   . Urinary tract infection     Past Surgical History:  Procedure Laterality Date  . CHOLECYSTECTOMY  2014    Family History  Problem Relation Age of Onset  . Lupus Mother   . Hyperlipidemia Father   . Hypertension Father   . Diabetes Maternal Grandfather   . Heart attack Maternal Grandfather   . Stroke Paternal Grandfather     Social History   Social History  . Marital status: Married    Spouse name: N/A  . Number of children: 2  . Years of education: N/A   Occupational History  . 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 Smoking status: Never Smoker  . Smokeless tobacco: Never Used  . Alcohol use 0.0 oz/week     Comment: occasional  . Drug use: No  . Sexual activity: Yes    Birth control/ protection: Pill   Other Topics Concern  . Not on file   Social History Narrative   Lives with husband in [place].  Active in her church.   Regular exercise:  Yes   RN at [employer]      Review of systems: Review of Systems  Constitutional: Negative for fever and chills. Positive for night sweats HENT: Negative.   Eyes: Negative for blurred vision.  Respiratory: Negative for cough, shortness of breath and wheezing.   Cardiovascular: Negative for chest pain and positive for palpitations.  Gastrointestinal: as per HPI Genitourinary: Negative for dysuria, urgency, frequency and hematuria.  Musculoskeletal: Negative for myalgias, positive for back pain and joint pain.  Skin: Negative for itching and rash.  Neurological: Negative for dizziness, tremors,  focal weakness, seizures and loss of consciousness.  Endo/Heme/Allergies: Negative for seasonal allergies.  Psychiatric/Behavioral: Negative for depression, suicidal ideas and hallucinations.  All other systems reviewed and are negative.   Physical Exam: Vitals:   02/18/16 0915  BP: 122/80  Pulse: 82   Body mass index is 22.6 kg/m. Gen:      No acute distress HEENT:  EOMI, sclera anicteric Neck:     No masses; no thyromegaly Lungs:    Clear to auscultation bilaterally; normal respiratory effort CV:         Regular rate and rhythm; no murmurs Abd:      + bowel sounds; soft, non-tender; no palpable masses, no distension Ext:    No edema; adequate peripheral perfusion Skin:      Warm and dry; no rash Neuro: alert and oriented x 3 Psych: normal mood and affect  Data Reviewed:  Reviewed labs, radiology imaging, old records and pertinent past GI work up  Abdominal ultrasound 02/2013 Liver normal. Portal vein patent. Gallstones. Sludge. Negative  Murphy's sign. Gallbladder wall thickness 1.7 mm. Common bile duct caliber  1.5 mm.   CT Chest 07/2015 Mediastinum/Lymph Nodes: Thyroid appears unremarkable. There is no demonstrable thoracic adenopathy. The pericardium is not thickened. There is no thoracic aortic aneurysm or dissection. The visualized great vessels appear unremarkable. There is no demonstrable major vessel pulmonary embolus.  Lungs/Pleura: There is a 3 mm nodular opacity in the inferior lingula seen on axial slice 47 series 3. Lungs are otherwise clear. No edema or consolidation. No appreciable pleural thickening.  Upper abdomen: There is hepatic steatosis. Visualized upper abdominal structures otherwise appear normal.  Musculoskeletal: There are no blastic or lytic bone lesions. No chest wall lesions identified.  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 [employer] with complaints of dyspepsia, indigestion, intermittent diarrhea and left  upper quadrant pain with CT chest findings concerning for fatty liver here for evaluation  She had normal CBD 1.515mm based on ultrasound prior to cholecystectomy Will repeat ultrasound to evaluate CBD and exclude CBD sludge Repeat LFT, mild elevation in ALT in the setting of fatty liver Advised patient to avoid NSAID, ETOH and hepatotoxins; and follow diet and exercise  Persistent dyspepsia, GERD symptoms and LUQ pain No improvement despite PPI Will schedule for EGD and do pH study (48 hr Bravo) to evaluate if heartburn and gerd symptoms related to acid reflux vs functional Check IgA and TTG Trial FD guard 1 capsule TID as needed Phazyme 1 capsule as needed  Chronic night sweats and joint pain with family h/o lupus (Mother) Will check ANA Consider referral to rheumatology  Greater than 50% of the time used for counseling as well as treatment plan and follow-up. She had multiple questions which were answered to her satisfaction  K. Scherry RanVeena Nandigam , MD [phone] Mon-Fri 8a-5p [phone] after 5p, weekends, holidays  CC: Sunnie Nielsenlexander, Natalie,  DO

## 2016-02-18 NOTE — Patient Instructions (Signed)
You have been scheduled for an abdominal ultrasound [facility] (1st floor of hospital) on 02/24/2016 at 8:15am. Please arrive 15 minutes prior to your appointment for registration. Make certain not to have anything to eat or drink 6 hours prior to your appointment. Should you need to reschedule your appointment, please contact radiology at [phone]. This test typically takes about 30 minutes to perform.  You have been scheduled for your Endoscopy at [facility], separate instructions have been given  Go to the basement for labs today  Use FD Gard 1-2 capsules three times a day as needed  Use Phazyme 1 capsule three times a day as needed

## 2016-02-20 LAB — ANA: Anti Nuclear Antibody(ANA): NEGATIVE

## 2016-02-20 LAB — TISSUE TRANSGLUTAMINASE, IGA: Tissue Transglutaminase Ab, IgA: 1 U/mL (ref <4)

## 2016-02-24 ENCOUNTER — Ambulatory Visit (HOSPITAL_COMMUNITY)
Admission: RE | Admit: 2016-02-24 | Discharge: 2016-02-24 | Disposition: A | Discharge status: Home / Self Care | Payer: BLUE CROSS/BLUE SHIELD | Source: Ambulatory Visit | Attending: Gastroenterology | Admitting: Gastroenterology

## 2016-02-24 DIAGNOSIS — R7989 Other specified abnormal findings of blood chemistry: Secondary | ICD-10-CM | POA: Insufficient documentation

## 2016-02-24 DIAGNOSIS — R1012 Left upper quadrant pain: Secondary | ICD-10-CM

## 2016-02-24 DIAGNOSIS — R1013 Epigastric pain: Secondary | ICD-10-CM

## 2016-02-24 DIAGNOSIS — Z9049 Acquired absence of other specified parts of digestive tract: Secondary | ICD-10-CM | POA: Insufficient documentation

## 2016-02-24 DIAGNOSIS — R197 Diarrhea, unspecified: Secondary | ICD-10-CM | POA: Insufficient documentation

## 2016-02-24 DIAGNOSIS — K760 Fatty (change of) liver, not elsewhere classified: Secondary | ICD-10-CM | POA: Diagnosis not present

## 2016-02-24 DIAGNOSIS — R945 Abnormal results of liver function studies: Secondary | ICD-10-CM

## 2016-02-27 ENCOUNTER — Encounter (HOSPITAL_COMMUNITY): Payer: Self-pay | Admitting: *Deleted

## 2016-03-03 ENCOUNTER — Ambulatory Visit (HOSPITAL_COMMUNITY)
Admission: RE | Admit: 2016-03-03 | Discharge: 2016-03-03 | Disposition: A | Discharge status: Home / Self Care | Payer: BLUE CROSS/BLUE SHIELD | Source: Ambulatory Visit | Attending: Gastroenterology | Admitting: Gastroenterology

## 2016-03-03 ENCOUNTER — Encounter (HOSPITAL_COMMUNITY)
Admission: RE | Disposition: A | Discharge status: Home / Self Care | Payer: Self-pay | Source: Ambulatory Visit | Attending: Gastroenterology

## 2016-03-03 ENCOUNTER — Encounter (HOSPITAL_COMMUNITY): Payer: Self-pay | Admitting: Anesthesiology

## 2016-03-03 ENCOUNTER — Ambulatory Visit (HOSPITAL_COMMUNITY): Payer: BLUE CROSS/BLUE SHIELD | Admitting: Anesthesiology

## 2016-03-03 DIAGNOSIS — R1013 Epigastric pain: Secondary | ICD-10-CM | POA: Diagnosis not present

## 2016-03-03 DIAGNOSIS — K219 Gastro-esophageal reflux disease without esophagitis: Secondary | ICD-10-CM | POA: Diagnosis not present

## 2016-03-03 DIAGNOSIS — R7989 Other specified abnormal findings of blood chemistry: Secondary | ICD-10-CM

## 2016-03-03 DIAGNOSIS — R945 Abnormal results of liver function studies: Secondary | ICD-10-CM

## 2016-03-03 DIAGNOSIS — Z9049 Acquired absence of other specified parts of digestive tract: Secondary | ICD-10-CM | POA: Insufficient documentation

## 2016-03-03 DIAGNOSIS — Z79899 Other long term (current) drug therapy: Secondary | ICD-10-CM | POA: Insufficient documentation

## 2016-03-03 DIAGNOSIS — Z793 Long term (current) use of hormonal contraceptives: Secondary | ICD-10-CM | POA: Diagnosis not present

## 2016-03-03 DIAGNOSIS — R12 Heartburn: Secondary | ICD-10-CM | POA: Diagnosis not present

## 2016-03-03 DIAGNOSIS — M25552 Pain in left hip: Secondary | ICD-10-CM | POA: Insufficient documentation

## 2016-03-03 DIAGNOSIS — M545 Low back pain: Secondary | ICD-10-CM | POA: Diagnosis not present

## 2016-03-03 DIAGNOSIS — R1012 Left upper quadrant pain: Secondary | ICD-10-CM

## 2016-03-03 DIAGNOSIS — M25551 Pain in right hip: Secondary | ICD-10-CM | POA: Diagnosis not present

## 2016-03-03 DIAGNOSIS — K760 Fatty (change of) liver, not elsewhere classified: Secondary | ICD-10-CM

## 2016-03-03 DIAGNOSIS — Z791 Long term (current) use of non-steroidal anti-inflammatories (NSAID): Secondary | ICD-10-CM | POA: Insufficient documentation

## 2016-03-03 DIAGNOSIS — R197 Diarrhea, unspecified: Secondary | ICD-10-CM

## 2016-03-03 HISTORY — PX: BRAVO PH STUDY: SHX5421

## 2016-03-03 HISTORY — PX: ESOPHAGOGASTRODUODENOSCOPY (EGD) WITH PROPOFOL: SHX5813

## 2016-03-03 SURGERY — ESOPHAGOGASTRODUODENOSCOPY (EGD) WITH PROPOFOL
Anesthesia: Monitor Anesthesia Care

## 2016-03-03 MED ORDER — PROPOFOL 10 MG/ML IV BOLUS
INTRAVENOUS | Status: AC
  Filled 2016-03-03: qty 20

## 2016-03-03 MED ORDER — SODIUM CHLORIDE 0.9 % IV SOLN: INTRAVENOUS | Status: DC

## 2016-03-03 MED ORDER — PROPOFOL 500 MG/50ML IV EMUL
INTRAVENOUS | Status: DC | PRN
  Administered 2016-03-03: 125 ug/kg/min via INTRAVENOUS

## 2016-03-03 MED ORDER — LACTATED RINGERS IV SOLN
INTRAVENOUS | Status: DC | PRN
  Administered 2016-03-03: 10:00:00 via INTRAVENOUS

## 2016-03-03 MED ORDER — MIDAZOLAM HCL 2 MG/2ML IJ SOLN
INTRAMUSCULAR | Status: AC
  Filled 2016-03-03: qty 2

## 2016-03-03 MED ORDER — MIDAZOLAM HCL 5 MG/5ML IJ SOLN
INTRAMUSCULAR | Status: DC | PRN
  Administered 2016-03-03: 2 mg via INTRAVENOUS

## 2016-03-03 MED ORDER — PROPOFOL 500 MG/50ML IV EMUL
INTRAVENOUS | Status: DC | PRN
  Administered 2016-03-03: 50 mg via INTRAVENOUS
  Administered 2016-03-03: 40 mg via INTRAVENOUS

## 2016-03-03 SURGICAL SUPPLY — 14 items

## 2016-03-03 NOTE — Op Note (Signed)
[facility] Patient Name: Carla Griffin Procedure Date: 03/03/2016 MRN: [id number] Attending MD: Napoleon FormKavitha V. Kaien Pezzullo , MD Date of Birth: Jan 01, 1981 CSN: [id number] Age: 35 Admit Type: Outpatient Procedure:                Upper GI endoscopy Indications:              Esophageal reflux symptoms that persist despite                            appropriate therapy, Dyspepsia Providers:                Napoleon FormKavitha V. Nissan Frazzini, MD, Janae SauceKaren D. Hinson, RN, Beryle BeamsJanie                            Billups, Technician, [employer] CorporationLogan Key, CRNA Referring MD:              Medicines:                Monitored Anesthesia Care Complications:            No immediate complications. Estimated Blood Loss:     Estimated blood loss was minimal. Procedure:                Pre-Anesthesia Assessment:                           - Prior to the procedure, a History and Physical                            was performed, and patient medications and                            allergies were reviewed. The patient's tolerance of                            previous anesthesia was also reviewed. The risks                            and benefits of the procedure and the sedation                            options and risks were discussed with the patient.                            All questions were answered, and informed consent                            was obtained. Prior Anticoagulants: The patient has                            taken no previous anticoagulant or antiplatelet                            agents. ASA Grade Assessment: II - A patient with  mild systemic disease. After reviewing the risks                            and benefits, the patient was deemed in                            satisfactory condition to undergo the procedure.                           After obtaining informed consent, the endoscope was                            passed under direct vision. Throughout the              procedure, the patient's blood pressure, pulse, and                            oxygen saturations were monitored continuously. The                            Endosonoscope was introduced through the mouth, and                            advanced to the second part of duodenum. The upper                            GI endoscopy was accomplished without difficulty.                            The patient tolerated the procedure well. Scope In: Scope Out: Findings:      The esophagus was normal. Biopsies were obtained from the proximal and       distal esophagus with cold forceps for histology of suspected       eosinophilic esophagitis.      The stomach was normal.      The examined duodenum was normal.      The BRAVO capsule was activated and then calibrated by submersion into       the appropriate buffer solutions. The BRAVO capsule with delivery system       was introduced through the mouth and advanced into the esophagus, such       that the BRAVO pH capsule was positioned 34 cm from the incisors, which       was 6 cm proximal to the GE junction. Suction was applied to the well of       the BRAVO pH capsule to suck in the adjacent mucosa of the esophagus       using the external vacuum pump for 45 seconds. The BRAVO pH capsule was       then deployed by depressing the plunger on top of the handle to advance       the locking pin into the mucosa, thereby attaching the capsule to the       esophagus. The plunger was then rotated a quarter turn clockwise to       release the capsule from the delivery system. The delivery system was       then withdrawn. Endoscopy was  utilized for probe placement and       diagnostic evaluation. Impression:               - Normal esophagus. Biopsied.                           - Normal stomach.                           - Normal examined duodenum.                           - The BRAVO capsule was activated and then                             calibrated by submersion into the appropriate                            buffer solutions.                           - The BRAVO pH capsule was deployed. Moderate Sedation:      N/A- Per Anesthesia Care Recommendation:           - Patient has a contact number available for                            emergencies. The signs and symptoms of potential                            delayed complications were discussed with the                            patient. Return to normal activities tomorrow.                            Written discharge instructions were provided to the                            patient.                           - Resume previous diet.                           - Continue present medications. Hold PPI or H2                            blocker for next 48 hours                           - Await pathology results.                           - No ibuprofen, naproxen, or other non-steroidal                            anti-inflammatory  drugs for 2 weeks. Procedure Code(s):        --- Professional ---                           [phone], Esophagogastroduodenoscopy, flexible,                            transoral; with biopsy, single or multiple Diagnosis Code(s):        --- Professional ---                           K21.9, Gastro-esophageal reflux disease without                            esophagitis                           R10.13, Epigastric pain CPT copyright 2016 American Medical Association. All rights reserved. The codes documented in this report are preliminary and upon coder review may  be revised to meet current compliance requirements. Napoleon Form, MD 03/03/2016 10:48:49 AM This report has been signed electronically. Number of Addenda: 0

## 2016-03-03 NOTE — Discharge Instructions (Signed)
YOU HAD AN ENDOSCOPIC PROCEDURE TODAY: Refer to the procedure report and other information in the discharge instructions given to you for any specific questions about what was found during the examination. If this information does not answer your questions, please call [place] office at [phone] to clarify.  ° °YOU SHOULD EXPECT: Some feelings of bloating in the abdomen. Passage of more gas than usual. Walking can help get rid of the air that was put into your GI tract during the procedure and reduce the bloating. If you had a lower endoscopy (such as a colonoscopy or flexible sigmoidoscopy) you may notice spotting of blood in your stool or on the toilet paper. Some abdominal soreness may be present for a day or two, also. ° °DIET: Your first meal following the procedure should be a light meal and then it is ok to progress to your normal diet. A half-sandwich or bowl of soup is an example of a good first meal. Heavy or fried foods are harder to digest and may make you feel nauseous or bloated. Drink plenty of fluids but you should avoid alcoholic beverages for 24 hours. If you had a esophageal dilation, please see attached instructions for diet.   ° °ACTIVITY: Your care partner should take you home directly after the procedure. You should plan to take it easy, moving slowly for the rest of the day. You can resume normal activity the day after the procedure however YOU SHOULD NOT DRIVE, use power tools, machinery or perform tasks that involve climbing or major physical exertion for 24 hours (because of the sedation medicines used during the test).  ° °SYMPTOMS TO REPORT IMMEDIATELY: °A gastroenterologist can be reached at any hour. Please call [phone]  for any of the following symptoms:  °Following lower endoscopy (colonoscopy, flexible sigmoidoscopy) °Excessive amounts of blood in the stool  °Significant tenderness, worsening of abdominal pains  °Swelling of the abdomen that is new, acute  °Fever of 100° or  higher  °Following upper endoscopy (EGD, EUS, ERCP, esophageal dilation) °Vomiting of blood or coffee ground material  °New, significant abdominal pain  °New, significant chest pain or pain under the shoulder blades  °Painful or persistently difficult swallowing  °New shortness of breath  °Black, tarry-looking or red, bloody stools ° °FOLLOW UP:  °If any biopsies were taken you will be contacted by phone or by letter within the next 1-3 weeks. Call [phone]  if you have not heard about the biopsies in 3 weeks.  °Please also call with any specific questions about appointments or follow up tests. ° °

## 2016-03-03 NOTE — H&P (View-Only) (Signed)
Carla Griffin    [id number]    1981-05-05  Primary Care Physician:Natalie Lyn Hollingshead, DO  Referring Physician: [name], DO [address] [place], [place] [address]  Chief complaint: LUQ abd pain, Epigastric pain, heartburn, excessive belching  HPI: 35 year old female referred by PMD with complaints of dyspepsia, indigestion and left upper quadrant pain for past 2-3 years, getting progressively worse. Certain foods make her symptoms worse like milk and beef with high fat content. She also has excessive belching and if she is unable to belch feels like something in stuck in her chest that improves at times with TUMS/GasX. No improvement with Protonix daily. She underwent cholecystectomy in 2014 for symptomatic gallstones/cholecystitis. Denies any dysphagia, odynophagia, nausea, vomiting, melena or bright red blood per rectum. She has intermittent loose stool, worse when she drinks coffee with milk or cream.    She has been waking up most night drenched with night sweats for past 2-3 years. Work as Charity fundraiser at [employer] , has been getting annual TB testing that's always negative. She takes NSAID's on regular basis about 2-3 times per week for low back pain and b/l hip pain since she was a teenager. Her mother has Lupus and she has seen a rheumatologist many years ago but doesn't recall if any work up was done   Outpatient Encounter Prescriptions as of 02/18/2016  Medication Sig  . JUNEL FE 1.5/30 1.5-30 MG-MCG tablet Take 1 tablet by mouth daily.  . Multiple Vitamin (MULTIVITAMIN) tablet Take 1 tablet by mouth daily.  . ondansetron (ZOFRAN-ODT) 8 MG disintegrating tablet Take 1 tablet (8 mg total) by mouth every 8 (eight) hours as needed for nausea or vomiting.  . pantoprazole (PROTONIX) 40 MG tablet TAKE 1 TABLET (40 MG TOTAL) BY MOUTH DAILY.  . Probiotic Product (ALIGN) 4 MG CAPS Take by mouth daily. Take 1 capsule daily.  Marland Kitchen dicyclomine (BENTYL) 20  MG tablet TAKE 1 TABLET BY MOUTH 4 TIMES A DAY BEFORE MEALS AND AT BED TIME (Patient not taking: Reported on 02/18/2016)   No facility-administered encounter medications on file as of 02/18/2016.     Allergies as of 02/18/2016  . (No Known Allergies)    Past Medical History:  Diagnosis Date  . Gallstones   . GERD (gastroesophageal reflux disease)   . IBS (irritable bowel syndrome)   . Pneumonia   . Urinary tract infection     Past Surgical History:  Procedure Laterality Date  . CHOLECYSTECTOMY  2014    Family History  Problem Relation Age of Onset  . Lupus Mother   . Hyperlipidemia Father   . Hypertension Father   . Diabetes Maternal Grandfather   . Heart attack Maternal Grandfather   . Stroke Paternal Grandfather     Social History   Social History  . Marital status: Married    Spouse name: N/A  . Number of children: 2  . Years of education: N/A   Occupational History  . 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 Smoking status: Never Smoker  . Smokeless tobacco: Never Used  . Alcohol use 0.0 oz/week     Comment: occasional  . Drug use: No  . Sexual activity: Yes    Birth control/ protection: Pill   Other Topics Concern  . Not on file   Social History Narrative   Lives with husband in [place].  Active in her church.   Regular exercise:  Yes   RN at [employer]      Review of systems: Review of Systems  Constitutional: Negative for fever and chills. Positive for night sweats HENT: Negative.   Eyes: Negative for blurred vision.  Respiratory: Negative for cough, shortness of breath and wheezing.   Cardiovascular: Negative for chest pain and positive for palpitations.  Gastrointestinal: as per HPI Genitourinary: Negative for dysuria, urgency, frequency and hematuria.  Musculoskeletal: Negative for myalgias, positive for back pain and joint pain.  Skin: Negative for itching and rash.  Neurological: Negative for dizziness, tremors,  focal weakness, seizures and loss of consciousness.  Endo/Heme/Allergies: Negative for seasonal allergies.  Psychiatric/Behavioral: Negative for depression, suicidal ideas and hallucinations.  All other systems reviewed and are negative.   Physical Exam: Vitals:   02/18/16 0915  BP: 122/80  Pulse: 82   Body mass index is 22.6 kg/m. Gen:      No acute distress HEENT:  EOMI, sclera anicteric Neck:     No masses; no thyromegaly Lungs:    Clear to auscultation bilaterally; normal respiratory effort CV:         Regular rate and rhythm; no murmurs Abd:      + bowel sounds; soft, non-tender; no palpable masses, no distension Ext:    No edema; adequate peripheral perfusion Skin:      Warm and dry; no rash Neuro: alert and oriented x 3 Psych: normal mood and affect  Data Reviewed:  Reviewed labs, radiology imaging, old records and pertinent past GI work up  Abdominal ultrasound 02/2013 Liver normal. Portal vein patent. Gallstones. Sludge. Negative  Murphy's sign. Gallbladder wall thickness 1.7 mm. Common bile duct caliber  1.5 mm.   CT Chest 07/2015 Mediastinum/Lymph Nodes: Thyroid appears unremarkable. There is no demonstrable thoracic adenopathy. The pericardium is not thickened. There is no thoracic aortic aneurysm or dissection. The visualized great vessels appear unremarkable. There is no demonstrable major vessel pulmonary embolus.  Lungs/Pleura: There is a 3 mm nodular opacity in the inferior lingula seen on axial slice 47 series 3. Lungs are otherwise clear. No edema or consolidation. No appreciable pleural thickening.  Upper abdomen: There is hepatic steatosis. Visualized upper abdominal structures otherwise appear normal.  Musculoskeletal: There are no blastic or lytic bone lesions. No chest wall lesions identified.  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 [employer] with complaints of dyspepsia, indigestion, intermittent diarrhea and left  upper quadrant pain with CT chest findings concerning for fatty liver here for evaluation  She had normal CBD 1.165mm based on ultrasound prior to cholecystectomy Will repeat ultrasound to evaluate CBD and exclude CBD sludge Repeat LFT, mild elevation in ALT in the setting of fatty liver Advised patient to avoid NSAID, ETOH and hepatotoxins; and follow diet and exercise  Persistent dyspepsia, GERD symptoms and LUQ pain No improvement despite PPI Will schedule for EGD and do pH study (48 hr Bravo) to evaluate if heartburn and gerd symptoms related to acid reflux vs functional Check IgA and TTG Trial FD guard 1 capsule TID as needed Phazyme 1 capsule as needed  Chronic night sweats and joint pain with family h/o lupus (Mother) Will check ANA Consider referral to rheumatology  Greater than 50% of the time used for counseling as well as treatment plan and follow-up. She had multiple questions which were answered to her satisfaction  K. Scherry RanVeena Nandigam , MD [phone] Mon-Fri 8a-5p [phone] after 5p, weekends, holidays  CC: Sunnie Nielsenlexander, Natalie,  DO

## 2016-03-03 NOTE — Anesthesia Preprocedure Evaluation (Addendum)
Anesthesia Evaluation  Patient identified by MRN, date of birth, ID band Patient awake    Reviewed: Allergy & Precautions, NPO status , Patient's Chart, lab work & pertinent test results  Airway Mallampati: II  TM Distance: >3 FB Neck ROM: Full    Dental no notable dental hx.    Pulmonary pneumonia, resolved,    Pulmonary exam normal breath sounds clear to auscultation       Cardiovascular negative cardio ROS Normal cardiovascular exam Rhythm:Regular Rate:Normal     Neuro/Psych negative neurological ROS  negative psych ROS   GI/Hepatic negative GI ROS, Neg liver ROS,   Endo/Other  negative endocrine ROS  Renal/GU negative Renal ROS  negative genitourinary   Musculoskeletal negative musculoskeletal ROS (+)   Abdominal   Peds negative pediatric ROS (+)  Hematology negative hematology ROS (+)   Anesthesia Other Findings   Reproductive/Obstetrics negative OB ROS                            Anesthesia Physical Anesthesia Plan  ASA: II  Anesthesia Plan: MAC   Post-op Pain Management:    Induction: Intravenous  Airway Management Planned: Natural Airway  Additional Equipment:   Intra-op Plan:   Post-operative Plan:   Informed Consent: I have reviewed the patients History and Physical, chart, labs and discussed the procedure including the risks, benefits and alternatives for the proposed anesthesia with the patient or authorized representative who has indicated his/her understanding and acceptance.   Dental advisory given  Plan Discussed with: CRNA  Anesthesia Plan Comments:         Anesthesia Quick Evaluation

## 2016-03-03 NOTE — Transfer of Care (Signed)
Immediate Anesthesia Transfer of Care Note  Patient: Carla Griffin  Procedure(s) Performed: Procedure(s): ESOPHAGOGASTRODUODENOSCOPY (EGD) WITH PROPOFOL (N/A) BRAVO PH STUDY (N/A)  Patient Location: PACU  Anesthesia Type:MAC  Level of Consciousness:  sedated, patient cooperative and responds to stimulation  Airway & Oxygen Therapy:Patient Spontanous Breathing and Patient connected to face mask oxgen  Post-op Assessment:  Report given to PACU RN and Post -op Vital signs reviewed and stable  Post vital signs:  Reviewed and stable  Last Vitals:  Vitals:   03/03/16 0935  BP: (!) 143/83  Pulse: (!) 108  Resp: 16  Temp: 36.7 C    Complications: No apparent anesthesia complications

## 2016-03-03 NOTE — Anesthesia Postprocedure Evaluation (Signed)
Anesthesia Post Note  Patient: Carla Griffin  Procedure(s) Performed: Procedure(s) (LRB): ESOPHAGOGASTRODUODENOSCOPY (EGD) WITH PROPOFOL (N/A) BRAVO PH STUDY (N/A)  Patient location during evaluation: PACU Anesthesia Type: MAC Level of consciousness: awake and alert Pain management: pain level controlled Vital Signs Assessment: post-procedure vital signs reviewed and stable Respiratory status: spontaneous breathing, nonlabored ventilation, respiratory function stable and patient connected to nasal cannula oxygen Cardiovascular status: stable and blood pressure returned to baseline Anesthetic complications: no    Last Vitals:  Vitals:   03/03/16 1048 03/03/16 1110  BP: 123/65 130/86  Pulse: (!) 108   Resp: 17   Temp: 37.1 C     Last Pain:  Vitals:   03/03/16 1048  TempSrc: Oral                 Brandan Glauber J

## 2016-03-03 NOTE — Interval H&P Note (Signed)
History and Physical Interval Note:  03/03/2016 9:19 AM  Carla Griffin  has presented today for surgery, with the diagnosis of LUQ Pain  Dyspepsia  Fatty Liver Abnormal LFT's   The various methods of treatment have been discussed with the patient and family. After consideration of risks, benefits and other options for treatment, the patient has consented to  Procedure(s): ESOPHAGOGASTRODUODENOSCOPY (EGD) WITH PROPOFOL (N/A) BRAVO PH STUDY (N/A) as a surgical intervention .  The patient's history has been reviewed, patient examined, no change in status, stable for surgery.  I have reviewed the patient's chart and labs.  Questions were answered to the patient's satisfaction.     Chelbi Herber

## 2016-03-04 ENCOUNTER — Encounter (HOSPITAL_COMMUNITY): Payer: Self-pay | Admitting: Gastroenterology

## 2016-03-17 ENCOUNTER — Other Ambulatory Visit: Payer: Self-pay

## 2016-03-17 MED ORDER — DEXLANSOPRAZOLE 30 MG PO CPDR: DELAYED_RELEASE_CAPSULE | ORAL | 3 refills | Status: DC

## 2016-03-17 MED ORDER — RANITIDINE HCL 300 MG PO TABS: 150.0000 mg | ORAL_TABLET | Freq: Every day | ORAL | 3 refills | Status: DC

## 2016-04-02 ENCOUNTER — Encounter: Payer: Self-pay | Admitting: Gastroenterology

## 2016-04-03 ENCOUNTER — Telehealth: Payer: Self-pay | Admitting: *Deleted

## 2016-04-03 NOTE — Telephone Encounter (Signed)
[name] for patient that I have not seen a prior auth form from her pharmacy regarding Dexilant  Told her to contact her pharmacy and have them to fax over a request form attention to me. At this fax number   The original message was sent in an email and [name] then sent it to me today..    .--- Message ----- From: Carla Griffin Sent: 04/02/2016   8:31 PM To: [facility] Clinical Pool Subject: Non-Urgent Medical Question                    I was wondering if there has been any word from my insurance about coverage for dexlansoprazole and if not, was there an alternative. It has been a few weeks since I have heard back on this.  Thanks, Carla Griffin

## 2016-04-06 ENCOUNTER — Telehealth: Payer: Self-pay | Admitting: Gastroenterology

## 2016-04-06 NOTE — Telephone Encounter (Signed)
Sent in prior auth request today to cover my meds

## 2016-04-07 ENCOUNTER — Telehealth: Payer: Self-pay | Admitting: Gastroenterology

## 2016-04-07 NOTE — Telephone Encounter (Signed)
Spoke with Romeo AppleBen at [facility], He stated that Dexilant has been approved,  Called patient back to inform her, She is going to call the pharmacy back

## 2016-04-07 NOTE — Telephone Encounter (Signed)
[name] for patient that Dexilant was approved

## 2016-04-07 NOTE — Telephone Encounter (Signed)
Dexilant approved  [name] for patient to contact her pharmacy

## 2016-04-08 ENCOUNTER — Encounter: Payer: Self-pay | Admitting: Gastroenterology

## 2016-04-08 ENCOUNTER — Telehealth: Payer: Self-pay | Admitting: *Deleted

## 2016-04-08 MED ORDER — ESOMEPRAZOLE MAGNESIUM 40 MG PO CPDR: 40.0000 mg | DELAYED_RELEASE_CAPSULE | Freq: Two times a day (BID) | ORAL | 3 refills | Status: DC

## 2016-04-08 NOTE — Telephone Encounter (Signed)
Sent in Nexium for patient  Will wait to see if this is rejected from [facility]

## 2016-04-08 NOTE — Telephone Encounter (Signed)
Dexilant is to expensive for patient. With approval from insurance Dexilant is still costing patient 200$  Wants to know what other alternative she has

## 2016-04-08 NOTE — Telephone Encounter (Signed)
We can switch her to Nexium 40 mg twice daily or Prevacid 30 mg twice daily, please advise patient to take 30 minutes before breakfast and dinner

## 2016-04-09 NOTE — Telephone Encounter (Signed)
Have sent Nexium to pharmacy for patient

## 2016-04-10 ENCOUNTER — Other Ambulatory Visit: Payer: Self-pay

## 2016-06-05 ENCOUNTER — Other Ambulatory Visit: Payer: Self-pay | Admitting: Family Medicine

## 2016-06-23 ENCOUNTER — Encounter: Payer: Self-pay | Admitting: Obstetrics and Gynecology

## 2016-06-23 ENCOUNTER — Ambulatory Visit (INDEPENDENT_AMBULATORY_CARE_PROVIDER_SITE_OTHER): Payer: BLUE CROSS/BLUE SHIELD | Admitting: Obstetrics and Gynecology

## 2016-06-23 VITALS — BP 130/86 | HR 103 | Ht 65.0 in | Wt 140.0 lb

## 2016-06-23 DIAGNOSIS — N951 Menopausal and female climacteric states: Secondary | ICD-10-CM

## 2016-06-23 DIAGNOSIS — Z01419 Encounter for gynecological examination (general) (routine) without abnormal findings: Secondary | ICD-10-CM | POA: Diagnosis not present

## 2016-06-23 DIAGNOSIS — Z0000 Encounter for general adult medical examination without abnormal findings: Secondary | ICD-10-CM

## 2016-06-23 DIAGNOSIS — Z309 Encounter for contraceptive management, unspecified: Secondary | ICD-10-CM | POA: Insufficient documentation

## 2016-06-23 DIAGNOSIS — Z3041 Encounter for surveillance of contraceptive pills: Secondary | ICD-10-CM

## 2016-06-23 MED ORDER — NORETHIN-ETH ESTRAD-FE BIPHAS 1 MG-10 MCG / 10 MCG PO TABS: 1.0000 | ORAL_TABLET | Freq: Every day | ORAL | 11 refills | Status: DC

## 2016-06-23 NOTE — Progress Notes (Signed)
Subjective:     Carla Griffin is a 36 y.o. female here for a routine exam. She reports perimenopausal Sx of qd hot flashes, particularly in the morning and 1-2 night sweats a week. Sx have progressed over the last yr.  Cycle monthly with OCP's BTB at times.  Current complaints:    Gynecologic History Patient's last menstrual period was 06/05/2016. Contraception: OCP (estrogen/progesterone) Last Pap: 2016. Results were: normal Last mammogram: N/A. Results were: N/A  Obstetric History OB History  Gravida Para Term Preterm AB Living  2 2 2     2   SAB TAB Ectopic Multiple Live Births               # Outcome Date GA Lbr Len/2nd Weight Sex Delivery Anes PTL Lv  2 Term           1 Term                The following portions of the patient's history were reviewed and updated as appropriate: allergies, current medications, past family history, past medical history, past social history and past surgical history.  Review of Systems A comprehensive review of systems was negative.    Objective:    BP 130/86   Pulse (!) 103   Ht 5\' 5"  (1.651 m)   Wt 140 lb (63.5 kg)   LMP 06/05/2016   BMI 23.30 kg/m   General Appearance:    Alert, cooperative, no distress, appears stated age  Head:    Normocephalic, without obvious abnormality, atraumatic  Eyes:    PERRL, conjunctiva/corneas clear, EOM's intact, fundi    benign, both eyes  Ears:    Normal TM's and external ear canals, both ears  Nose:   Nares normal, septum midline, mucosa normal, no drainage    or sinus tenderness  Throat:   Lips, mucosa, and tongue normal; teeth and gums normal  Neck:   Supple, symmetrical, trachea midline, no adenopathy;    thyroid:  no enlargement/tenderness/nodules; no carotid   bruit or JVD  Back:     Symmetric, no curvature, ROM normal, no CVA tenderness  Lungs:     Clear to auscultation bilaterally, respirations unlabored  Chest Wall:    No tenderness or deformity   Heart:    Regular rate and rhythm, S1  and S2 normal, no murmur, rub   or gallop  Breast Exam:    No tenderness, masses, or nipple abnormality  Abdomen:     Soft, non-tender, bowel sounds active all four quadrants,    no masses, no organomegaly  Genitalia:    Normal female without lesion, discharge or tenderness  Rectal:    Normal tone, normal prostate, no masses or tenderness;   guaiac negative stool  Extremities:   Extremities normal, atraumatic, no cyanosis or edema  Pulses:   2+ and symmetric all extremities  Skin:   Skin color, texture, turgor normal, no rashes or lesions  Lymph nodes:   Cervical, supraclavicular, and axillary nodes normal  Neurologic:   CNII-XII intact, normal strength, sensation and reflexes    throughout      Assessment:    Healthy female exam.   Perimenopausal Sx Plan:  Tx options for perimenopausal Sx reviewed. Will d/c Junel and switch to [employer]in  U/R/B reviewed.  F/U in June.

## 2016-06-26 LAB — CYTOLOGY - PAP
DIAGNOSIS: NEGATIVE
HPV (WINDOPATH): NOT DETECTED

## 2016-06-29 ENCOUNTER — Encounter: Payer: Self-pay | Admitting: Obstetrics and Gynecology

## 2016-06-29 ENCOUNTER — Encounter: Payer: Self-pay | Admitting: *Deleted

## 2016-07-01 ENCOUNTER — Telehealth: Payer: Self-pay | Admitting: *Deleted

## 2016-07-01 DIAGNOSIS — N951 Menopausal and female climacteric states: Secondary | ICD-10-CM

## 2016-07-01 DIAGNOSIS — Z3041 Encounter for surveillance of contraceptive pills: Secondary | ICD-10-CM

## 2016-07-01 MED ORDER — NORETHIN ACE-ETH ESTRAD-FE 1-20 MG-MCG PO TABS: 1.0000 | ORAL_TABLET | Freq: Every day | ORAL | 11 refills | Status: DC

## 2016-07-01 NOTE — Telephone Encounter (Signed)
Pt called stating that her ins would not pay for the Loestrin and wanted to go back on Junel.  Spoke with Dr Alysia PennaErvin who Ok'd her to go back on the [place] and RX was sent to her pharmacy.

## 2016-08-01 ENCOUNTER — Other Ambulatory Visit: Payer: Self-pay | Admitting: Gastroenterology

## 2016-11-23 ENCOUNTER — Ambulatory Visit (INDEPENDENT_AMBULATORY_CARE_PROVIDER_SITE_OTHER): Payer: BLUE CROSS/BLUE SHIELD | Admitting: Osteopathic Medicine

## 2016-11-23 ENCOUNTER — Encounter: Payer: Self-pay | Admitting: Osteopathic Medicine

## 2016-11-23 VITALS — BP 135/85 | HR 93 | Wt 142.0 lb

## 2016-11-23 DIAGNOSIS — R55 Syncope and collapse: Secondary | ICD-10-CM | POA: Diagnosis not present

## 2016-11-23 DIAGNOSIS — R000 Tachycardia, unspecified: Secondary | ICD-10-CM | POA: Diagnosis not present

## 2016-11-23 DIAGNOSIS — I951 Orthostatic hypotension: Secondary | ICD-10-CM | POA: Diagnosis not present

## 2016-11-23 DIAGNOSIS — G90A Postural orthostatic tachycardia syndrome (POTS): Secondary | ICD-10-CM | POA: Insufficient documentation

## 2016-11-23 LAB — COMPLETE METABOLIC PANEL WITH GFR
ALBUMIN: 4.2 g/dL (ref 3.6–5.1)
ALK PHOS: 57 U/L (ref 33–115)
ALT: 14 U/L (ref 6–29)
AST: 14 U/L (ref 10–30)
BILIRUBIN TOTAL: 0.4 mg/dL (ref 0.2–1.2)
BUN: 11 mg/dL (ref 7–25)
CO2: 26 mmol/L (ref 20–31)
Calcium: 9.3 mg/dL (ref 8.6–10.2)
Chloride: 103 mmol/L (ref 98–110)
Creat: 0.86 mg/dL (ref 0.50–1.10)
GFR, EST NON AFRICAN AMERICAN: 87 mL/min (ref >=60)
GFR, Est African American: >89 mL/min (ref >=60)
GLUCOSE: 89 mg/dL (ref 65–99)
Potassium: 4.2 mmol/L (ref 3.5–5.3)
SODIUM: 139 mmol/L (ref 135–146)
TOTAL PROTEIN: 6.8 g/dL (ref 6.1–8.1)

## 2016-11-23 LAB — CBC
HCT: 40.6 % (ref 35.0–45.0)
HEMOGLOBIN: 13.2 g/dL (ref 11.7–15.5)
MCH: 30.3 pg (ref 27.0–33.0)
MCHC: 32.5 g/dL (ref 32.0–36.0)
MCV: 93.3 fL (ref 80.0–100.0)
MPV: 9.5 fL (ref 7.5–12.5)
PLATELETS: 273 10*3/uL (ref 140–400)
RBC: 4.35 MIL/uL (ref 3.80–5.10)
RDW: 13 % (ref 11.0–15.0)
WBC: 5.6 10*3/uL (ref 3.8–10.8)

## 2016-11-23 LAB — MAGNESIUM: Magnesium: 1.9 mg/dL (ref 1.5–2.5)

## 2016-11-23 NOTE — Progress Notes (Signed)
HPI: Carla Griffin is a 36 y.o. female  who [facility], 11/23/16,  for chief complaint of:  Chief Complaint  Patient presents with  . Loss of Consciousness    Dizziness and LOC  . Context: previous episode in 05/2016, no w/u done. Felt flushed and lightheaded at work, BP was low (works as Engineer, civil (consulting)nurse) and recovered ok, no LOC. Hx palpitations, previous eval 05/2015 by cardiology e/ echo and holter no concerns . Quality: vision and hearing went blank and then felt falling, woke up on floor. Witnessed by 6yo son, thinks only out for a few seconds . Duration & timing: LOC x3 seconds, happened yesterday    Records reviewed:   Cardio 06/12/2015 Dr. Anne FuSkains: nonspecific ST/T wave abnormality comment on EKG   Holter 06/21/2015 Notes Recorded by Jake BatheMark C Skains, MD on 07/29/2015 at 5:12 PM   No adverse arrhythmias   Brief paroxysmal atrial tachycardia, benign   Occasional PVCs   There is no evidence of reentrant tachycardia  Echo 06/21/2015 Notes Recorded by Jake BatheMark C Skains, MD on 06/21/2015 at 5:00 PM Reassuring echocardiogram, normal systolic function. Noncardiac chest pain  .   Past medical, surgical, social and family history reviewed: Patient Active Problem List   Diagnosis Date Noted  . Gynecologic exam normal 06/23/2016  . Contraception management 06/23/2016  . Perimenopausal vasomotor symptoms 06/23/2016  . Heartburn   . Dyspepsia   . Left sided abdominal pain 01/08/2016  . Nausea without vomiting 01/08/2016  . Loose stools 01/08/2016  . Generalized abdominal cramps 01/08/2016  . Early satiety 01/08/2016  . Swallowing difficulty 01/08/2016  . Elevated ALT measurement 01/08/2016  . Palpitations 08/14/2015  . Gastroesophageal reflux disease without esophagitis 08/14/2015  . Left-sided thoracic back pain 04/29/2015  . Fatigue 04/29/2015  . UNSPECIFIED VITAMIN D DEFICIENCY 05/06/2009  . BACK PAIN WITH RADICULOPATHY  02/15/2009   Past Surgical History:  Procedure Laterality Date  . BRAVO PH STUDY N/A 03/03/2016   Procedure: BRAVO PH STUDY;  Surgeon: Napoleon FormKavitha V Nandigam, MD;  Location: [facility];  Service: Endoscopy;  Laterality: N/A;  . CHOLECYSTECTOMY  2014  . ESOPHAGOGASTRODUODENOSCOPY (EGD) WITH PROPOFOL N/A 03/03/2016   Procedure: ESOPHAGOGASTRODUODENOSCOPY (EGD) WITH PROPOFOL;  Surgeon: Napoleon FormKavitha V Nandigam, MD;  Location: [facility];  Service: Endoscopy;  Laterality: N/A;  . WISDOM TOOTH EXTRACTION     Social History  Substance Use Topics  . Smoking status: Never Smoker  . Smokeless tobacco: Never Used  . Alcohol use 0.0 oz/week     Comment: occasional   Family History  Problem Relation Age of Onset  . Lupus Mother   . Hyperlipidemia Father   . Hypertension Father   . Diabetes Maternal Grandfather   . Heart attack Maternal Grandfather   . Stroke Paternal Grandfather      Current medication list and allergy/intolerance information reviewed:   Current Outpatient Prescriptions  Medication Sig Dispense Refill  . acetaminophen (TYLENOL) 500 MG tablet Take 500 mg by mouth daily as needed. Once daily if needed    . esomeprazole (NEXIUM) 40 MG capsule TAKE 1 CAPSULE (40 MG TOTAL) BY MOUTH 2 (TWO) TIMES DAILY BEFORE A MEAL. 60 capsule 3  . Multiple Vitamin (MULTIVITAMIN) tablet Take 1 tablet by mouth daily.    . norethindrone-ethinyl estradiol (JUNEL FE 1/20) 1-20 MG-MCG tablet Take 1 tablet by mouth daily. 1 Package 11  . Probiotic Product (ALIGN) 4 MG CAPS Take by mouth daily before breakfast. Take 1 capsule daily.     .Marland Kitchen  ranitidine (ZANTAC) 300 MG tablet Take 0.5 tablets (150 mg total) by mouth at bedtime. 30 tablet 3   No current facility-administered medications for this visit.    No Known Allergies    Review of Systems:  Constitutional:  No  fever, no chills, No recent illness,   HEENT: +occasional headache, no vision change  Cardiac: No  chest pain, No  pressure, +occasional  palpitations, No  Orthopnea  Respiratory:  No  shortness of breath. No  Cough  Gastrointestinal: No  abdominal pain, No  nausea, No  vomiting  Musculoskeletal: No new myalgia/arthralgia  Skin: No  Rash, No other wounds/concerning lesions  Hem/Onc: No  easy bruising/bleeding  Endocrine: No cold intolerance,  No heat intolerance. No polyuria/polydipsia/polyphagia   Neurologic: No  weakness, +dizziness, No  slurred speech/focal weakness/facial droop  Psychiatric: No  concerns with depression, No  concerns with anxiety  Exam:  BP 135/85   Pulse 93   Wt 142 lb (64.4 kg)   LMP 11/10/2016   SpO2 100%   BMI 23.63 kg/m   Ortho vitals (+)tachycardia   Constitutional: VS see above. General Appearance: alert, well-developed, well-nourished, NAD  Eyes: Normal lids and conjunctive, non-icteric sclera  Ears, Nose, Mouth, Throat: MMM, Normal external inspection ears/nares/mouth/lips/gums.   Neck: No masses, trachea midline. No thyroid enlargement. No tenderness/mass appreciated. No lymphadenopathy  Respiratory: Normal respiratory effort. no wheeze, no rhonchi, no rales  Cardiovascular: S1/S2 normal, no murmur, no rub/gallop auscultated. RRR. No lower extremity edema.    Musculoskeletal: Gait normal. No clubbing/cyanosis of digits.   Neurological: Normal balance/coordination. No tremor. No cranial nerve deficit on limited exam. Motor and sensation intact and symmetric. Cerebellar reflexes intact.   Skin: warm, dry, intact. No rash/ulcer.   Psychiatric: Normal judgment/insight. Normal mood and affect. Oriented x3.     ASSESSMENT/PLAN: orthostatic tachycardia, cautions reviewed, will defer medications to cardiology and will get labs now, will work on getting expedited appt w/ cardiologist  Postural orthostatic tachycardia syndrome - Orthostatic VS (+)elevated HR and reproduction of milder symptoms with postional changes - Plan: CBC, COMPLETE METABOLIC PANEL WITH GFR, TSH, Magnesium,  Ambulatory referral to Cardiology  Syncope, unspecified syncope type - Plan: EKG 12-Lead, Ambulatory referral to Cardiology    Patient Instructions   I would suggest follow-up with your cardiologist ASAP  May need to repeat Holter monitor and Echo, or they may decide alternative testing such as stress test is needed.   I am reluctant to start medications at this point without having these tests done first, but there are medications we can give for tachycardia.   We will get labs in the meantime  Stay well-hydrated and well-nourished!   Get up from lying/seated to seated/standing positions slowly!     Visit summary with medication list and pertinent instructions was printed for patient to review. All questions at time of visit were answered - patient instructed to contact office with any additional concerns. ER/RTC precautions were reviewed with the patient. Follow-up plan: Return for recheck depending on symptoms / cardiology recommendations .

## 2016-11-23 NOTE — Patient Instructions (Signed)
   I would suggest follow-up with your cardiologist ASAP  May need to repeat Holter monitor and Echo, or they may decide alternative testing such as stress test is needed.   I am reluctant to start medications at this point without having these tests done first, but there are medications we can give for tachycardia.   We will get labs in the meantime  Stay well-hydrated and well-nourished!   Get up from lying/seated to seated/standing positions slowly!

## 2016-11-24 ENCOUNTER — Encounter: Payer: Self-pay | Admitting: Osteopathic Medicine

## 2016-11-24 ENCOUNTER — Other Ambulatory Visit: Payer: Self-pay | Admitting: Gastroenterology

## 2016-11-24 LAB — TSH: TSH: 1.43 mIU/L

## 2016-11-27 NOTE — Telephone Encounter (Signed)
Dr. Lyn HollingsheadAlexander   I called Cardiovascular and they stated the 25th was the earliest appointment they have. They also stated when patient called she was offered 7/12 but asked for a later date. - CF

## 2016-12-15 NOTE — Progress Notes (Addendum)
Cardiology Office Note    Date:  12/16/2016  ID:  Carla Griffin, DOB 08/07/1980, MRN [id number] PCP:  Sunnie NielsenAlexander, Natalie, DO  Cardiologist: Dr. Anne FuSkains   Chief Complaint: syncope  History of Present Illness:  Carla Griffin is a 36 y.o. 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  She was remotely evaluated by Dr. Anne FuSkains for atypical CP and CP with the above monitor findings. 2D Echo 05/2015: EF 60-65%, systolic flattening.   She saw PCP 11/23/16 for an episode of syncope with diagnosis of POTS - note indictes "orthostatic VS (+)elevated HR and reproduction of milder symptoms with postional changes." Labs 11/23/16 showed normal TSH, CMET, CBC, Mg.  She reports that about 6 months ago when starting her job at [employer], she was standing up giving report when she felt hot, nauseated, weakness in her legs and nearly passed out. Blood pressure was 70/40s. Symptoms and BP recovered while sitting down. Since that time she feels like she's had more lightheadedness when standing. This might be worse in the heat recently. She also reports shower intolerance - feels her heart racing and more lightheaded after a shower. A few weeks ago she had gotten her son up from his nap. She was sitting down and went to stand up to open the blinds and felt lightheaded, heart racing, vision going dark, and woke up on the floor to her son laughing. She thinks she was only out for a second or two. No b/b incontinence reported or significant injury. She felt her heart racing still but this gradually went away over the next few hours. She had a dull headache. She reports increased frequency of headaches recently including migraines that have woken her out of sleep requiring her to take Excedrin. She denies possibility of being pregnant, states she's currently on her period. She's unsure of  any relationship to cycles. No h/o exogenous steroid use. She also has been told she may be in perimenopause. Exercised last week with lots of walking on her 10 year anniversary without any chest pain, SOB or functional limitation.  Orthostatics were negative in clinic today, but associated with symptoms: Lying P76, BP 131/82  Sitting P81, BP 162/96 - felt lightheaded, heart racing Standing754m P83, BP 150/97 - lightheaded, dizzy Standing 7314m P83, BP 130/89   Past Medical History:  Diagnosis Date  . Gallstones   . GERD (gastroesophageal reflux disease)   . IBS (irritable bowel syndrome)   . Pneumonia   . Urinary tract infection     Past Surgical History:  Procedure Laterality Date  . BRAVO PH STUDY N/A 03/03/2016   Procedure: BRAVO PH STUDY;  Surgeon: Napoleon FormKavitha V Nandigam, MD;  Location: [facility];  Service: Endoscopy;  Laterality: N/A;  . CHOLECYSTECTOMY  2014  . ESOPHAGOGASTRODUODENOSCOPY (EGD) WITH PROPOFOL N/A 03/03/2016   Procedure: ESOPHAGOGASTRODUODENOSCOPY (EGD) WITH PROPOFOL;  Surgeon: Napoleon FormKavitha V Nandigam, MD;  Location: [facility];  Service: Endoscopy;  Laterality: N/A;  . WISDOM TOOTH EXTRACTION      Current Medications: Current Meds  Medication Sig  . acetaminophen (TYLENOL) 500 MG tablet Take 500 mg by mouth daily as needed. Once daily if needed  . esomeprazole (NEXIUM) 40 MG capsule TAKE 1 CAPSULE (40 MG TOTAL) BY MOUTH 2 (TWO) TIMES DAILY BEFORE A MEAL.  . Multiple Vitamin (MULTIVITAMIN) tablet Take 1 tablet by mouth daily.  . norethindrone-ethinyl estradiol (  JUNEL FE 1/20) 1-20 MG-MCG tablet Take 1 tablet by mouth daily.  . Probiotic Product (ALIGN) 4 MG CAPS Take by mouth daily before breakfast. Take 1 capsule daily.   . ranitidine (ZANTAC) 150 MG tablet TAKE 0.5 MG TABLET BY MOUTH AS NEEDED FOR HEARTBURN     Allergies:   Patient has no known allergies.   Social History   Social History  . Marital status: Married    Spouse name: N/A  . Number of  children: 2  . Years of education: N/A   Occupational History  . 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 Smoking status: Never Smoker  . Smokeless tobacco: Never Used  . Alcohol use 0.0 oz/week     Comment: occasional  . Drug use: No  . Sexual activity: Yes    Birth control/ protection: None   Other Topics Concern  . None   Social History Narrative   Lives with husband in [place].   Active in her church.   Regular exercise:  Yes   RN at [employer]     Family History:  Family History  Problem Relation Age of Onset  . Lupus Mother   . Hyperlipidemia Father   . Hypertension Father   . Diabetes Maternal Grandfather   . Heart attack Maternal Grandfather   . Stroke Paternal Grandfather      ROS:   Please see the history of present illness.  All other systems are reviewed and otherwise negative.    PHYSICAL EXAM:   VS:  Ht 5\' 6"  (1.676 m)   Wt 146 lb 1.9 oz (66.3 kg)   SpO2 96%   BMI 23.58 kg/m  Pulse 88 BMI: Body mass index is 23.58 kg/m.  GEN: Well nourished, well developed well appearing WF, in no acute distress  HEENT: normocephalic, atraumatic Neck: no JVD, carotid bruits, or masses Cardiac: RRR; no murmurs, rubs, or gallops, no edema  Respiratory:  clear to auscultation bilaterally, normal work of breathing GI: soft, nontender, nondistended, + BS MS: no deformity or atrophy  Skin: warm and dry, no rash Neuro:  Alert and Oriented x 3, Strength and sensation are intact, follows commands Psych: euthymic mood, full affect  Wt Readings from Last 3 Encounters:  12/16/16 146 lb 1.9 oz (66.3 kg)  11/23/16 142 lb (64.4 kg)  06/23/16 140 lb (63.5 kg)      Studies/Labs Reviewed:   EKG:  EKG was ordered today and personally reviewed by me and demonstrates NSR 78bpm with sinus arrhythmia, no acute ST-T changes, normal intervals, QTc .  Recent Labs: 11/23/2016: ALT 14; BUN 11; Creat 0.86; Hemoglobin 13.2; Magnesium 1.9; Platelets 273;  Potassium 4.2; Sodium 139; TSH 1.43   Lipid Panel    Component Value Date/Time   CHOL 171 08/16/2009 0916   TRIG 46.0 08/16/2009 0916   HDL 68.70 08/16/2009 0916   CHOLHDL 2 08/16/2009 0916   VLDL 9.2 08/16/2009 0916   LDLCALC 93 08/16/2009 0916    Additional studies/ records that were reviewed today include: Summarized above, also: Event monitor resulted 07/2015   No adverse arrhythmias   Brief paroxysmal atrial tachycardia, benign   Occasional PVCs   There is no evidence of reentrant tachycardia    ASSESSMENT & PLAN:   1. Syncope - although orthostatics today in clinic were not consistent with diagnosis of POTS, she is describing several symptoms c/w the syndrome. Given reports of increased headaches recently (including waking her  out of sleep), I feel that noncontrasted head CT is indicated to r/o possibility of intracranial lesion. She also has reported some associated palpitations. QTc is normal. No family history of SCD or arrhythmia. Will update 2D echo and 30 day event monitor. Discussed increasing noncaffeinated fluid intake (including acutely ingesting 12-16oz before getting out of bed or any activities which seem to aggravate the symptoms). Also recommended liberalizing sodium intake, avoiding the heat/hot showers, and using compression hose. Discussed [place] DMV recommendation of no driving x 6 months given unexplained syncope. If echo and 30 day monitor are normal, may benefit from formal exercise program as this has been shown to help some POTS patients. Fortunately prognosis is usually favorable. I wonder if there is any relationship to her recent diagnosis of possible perimenopause as some patients have reported relationship to menstrual cycle. 2. Orthostasis - see above. Noted at PCP office, specifics not clear. 3. PVCs - reassess by monitor. 4. PAT - reassess by monitor.  Disposition: F/u with Dr. Blenda Mounts team APP after above testing.   Medication  Adjustments/Labs and Tests Ordered: Current medicines are reviewed at length with the patient today.  Concerns regarding medicines are outlined above. Medication changes, Labs and Tests ordered today are summarized above and listed in the Patient Instructions accessible in Encounters.   Signed, Laurann Montana, PA-C  12/16/2016 9:16 AM    [facility] [address] [place], [place], [place]  [address] Phone: [phone]; Fax: [phone]

## 2016-12-16 ENCOUNTER — Ambulatory Visit (INDEPENDENT_AMBULATORY_CARE_PROVIDER_SITE_OTHER): Payer: BLUE CROSS/BLUE SHIELD | Admitting: Physician Assistant

## 2016-12-16 ENCOUNTER — Encounter: Payer: Self-pay | Admitting: Physician Assistant

## 2016-12-16 VITALS — Ht 66.0 in | Wt 146.1 lb

## 2016-12-16 DIAGNOSIS — I471 Supraventricular tachycardia: Secondary | ICD-10-CM

## 2016-12-16 DIAGNOSIS — I493 Ventricular premature depolarization: Secondary | ICD-10-CM

## 2016-12-16 DIAGNOSIS — R519 Headache, unspecified: Secondary | ICD-10-CM

## 2016-12-16 DIAGNOSIS — R51 Headache: Secondary | ICD-10-CM

## 2016-12-16 DIAGNOSIS — R55 Syncope and collapse: Secondary | ICD-10-CM | POA: Diagnosis not present

## 2016-12-16 DIAGNOSIS — I951 Orthostatic hypotension: Secondary | ICD-10-CM

## 2016-12-16 NOTE — Patient Instructions (Addendum)
Medication Instructions: Your physician recommends that you continue on your current medications as directed. Please refer to the Current Medication list given to you today.   Labwork: None ordered  Testing/Procedures: Your physician has requested that you have an echocardiogram. Echocardiography is a painless test that uses sound waves to create images of your heart. It provides your doctor with information about the size and shape of your heart and how well your heart's chambers and valves are working. This procedure takes approximately one hour. There are no restrictions for this procedure.  Non-Cardiac CT scanning, (CAT scanning), is a noninvasive, special x-ray that produces cross-sectional images of the body using x-rays and a computer. CT scans help physicians diagnose and treat medical conditions. For some CT exams, a contrast material is used to enhance visibility in the area of the body being studied. CT scans provide greater clarity and reveal more details than regular x-ray exams.  Your physician has recommended that you wear an event monitor. Event monitors are medical devices that record the heart's electrical activity. Doctors most often us these monitors to diagnose arrhythmias. Arrhythmias are problems with the speed or rhythm of the heartbeat. The monitor is a small, portable device. You can wear one while you do your normal daily activities. This is usually used to diagnose what is causing palpitations/syncope (passing out).    Follow-Up: Your physician recommends that you schedule a follow-up appointment in: WITH DR. Anne FuSKAINS OR HIS CARE TEAM AFTER THE MONITOR AND ECHOCARDIOGRAM   Any Other Special Instructions Will Be Listed Below (If Applicable).  Please increase your non-caffeinated fluid intake. It may be helpful to drink 12-16 oz of fluid before any typical aggravating activities such as showering or standing up after sitting for long periods. Would also recommend you  increase your dietary sodium intake. You should try compression hose. Avoid being out in the heat as much as possible and avoid hot showers (lukewarm is OK).    Echocardiogram An echocardiogram, or echocardiography, uses sound waves (ultrasound) to produce an image of your heart. The echocardiogram is simple, painless, obtained within a short period of time, and offers valuable information to your health care provider. The images from an echocardiogram can provide information such as:  Evidence of coronary artery disease (CAD).  Heart size.  Heart muscle function.  Heart valve function.  Aneurysm detection.  Evidence of a past heart attack.  Fluid buildup around the heart.  Heart muscle thickening.  Assess heart valve function.  Tell a health care provider about:  Any allergies you have.  All medicines you are taking, including vitamins, herbs, eye drops, creams, and over-the-counter medicines.  Any problems you or family members have had with anesthetic medicines.  Any blood disorders you have.  Any surgeries you have had.  Any medical conditions you have.  Whether you are pregnant or may be pregnant. What happens before the procedure? No special preparation is needed. Eat and drink normally. What happens during the procedure?  In order to produce an image of your heart, gel will be applied to your chest and a wand-like tool (transducer) will be moved over your chest. The gel will help transmit the sound waves from the transducer. The sound waves will harmlessly bounce off your heart to allow the heart images to be captured in real-time motion. These images will then be recorded.  You may need an IV to receive a medicine that improves the quality of the pictures. What happens after the procedure? You may  return to your normal schedule including diet, activities, and medicines, unless your health care provider tells you otherwise. This information is not intended to  replace advice given to you by your health care provider. Make sure you discuss any questions you have with your health care provider. Document Released: 05/08/2000 Document Revised: 12/28/2015 Document Reviewed: 01/16/2013 Elsevier Interactive Patient Education  2017 Elsevier Inc.    CT Scan A CT scan is a kind of X-ray. A CT scan makes pictures of the inside of your body. In this procedure, the pictures will be taken in a large machine that has an opening (CT scanner). What happens before the procedure? Staying hydrated Follow instructions from your doctor about hydration, which may include:  Up to 2 hours before the procedure - you may continue to drink clear liquids. These include water, clear fruit juice, black coffee, and plain tea.  Eating and drinking restrictions Follow instructions from your doctor about eating and drinking, which may include:  24 hours before the procedure - stop drinking caffeinated drinks. These include energy drinks, tea, soda, coffee, and hot chocolate.  8 hours before the procedure - stop eating heavy meals or foods. These include meat, fried foods, or fatty foods.  6 hours before the procedure - stop eating light meals or foods. These include toast or cereal.  6 hours before the procedure - stop drinking milk or drinks that have milk in them.  2 hours before the procedure - stop drinking clear liquids.  General instructions  Take off any jewelry.  Ask your doctor about changing or stopping your normal medicines. This is important if you take diabetes medicines or blood thinners. What happens during the procedure?  You will lie on a table with your arms above your head.  An IV tube may be put into one of your veins.  Dye may be put into the IV tube. You may feel warm or have a metal taste in your mouth.  The table you will be lying on will move into the CT scanner.  You will be able to see, hear, and talk to the person who is running the  machine while you are in it. Follow that person's directions.  The machine will move around you to take pictures. Do not move.  When the machine is done taking pictures, it will be turned off.  The table will be moved out of the machine.  Your IV tube will be taken out. The procedure may vary among doctors and hospitals. What happens after the procedure?  It is up to you to get your results. Ask when your results will be ready. Summary  A CT scan is a kind of X-ray.  A CT scan makes pictures of the inside of your body.  Follow instructions from your doctor about eating and drinking before the procedure.  You will be able to see, hear, and talk to the person who is running the machine while you are in it. Follow that person's directions. This information is not intended to replace advice given to you by your health care provider. Make sure you discuss any questions you have with your health care provider. Document Released: 08/07/2008 Document Revised: 05/28/2016 Document Reviewed: 05/28/2016 Elsevier Interactive Patient Education  2017 Elsevier Inc.    Cardiac Event Monitoring A cardiac event monitor is a small recording device that is used to detect abnormal heart rhythms (arrhythmias). The monitor is used to record your heart rhythm when you have symptoms, such as:  Fast heartbeats (palpitations), such as heart racing or fluttering.  Dizziness.  Fainting or light-headedness.  Unexplained weakness.  Some monitors are wired to electrodes placed on your chest. Electrodes are flat, sticky disks that attach to your skin. Other monitors may be hand-held or worn on the wrist. The monitor can be worn for up to 30 days. If the monitor is attached to your chest, a technician will prepare your chest for the electrode placement and show you how to work the monitor. Take time to practice using the monitor before you leave the office. Make sure you understand how to send the information  from the monitor to your health care provider. In some cases, you may need to use a landline telephone instead of a cell phone. What are the risks? Generally, this device is safe to use, but it possible that the skin under the electrodes will become irritated. How to use your cardiac event monitor  Wear your monitor at all times, except when you are in water: ? Do not let the monitor get wet. ? Take the monitor off when you bathe. Do not swim or use a hot tub with it on.  Keep your skin clean. Do not put body lotion or moisturizer on your chest.  Change the electrodes as told by your health care provider or any time they stop sticking to your skin. You may need to use medical tape to keep them on.  Try to put the electrodes in slightly different places on your chest to help prevent skin irritation. They must remain in the area under your left breast and in the upper right section of your chest.  Make sure the monitor is safely clipped to your clothing or in a location close to your body that your health care provider recommends.  Press the button to record as soon as you feel heart-related symptoms, such as: ? Dizziness. ? Weakness. ? Light-headedness. ? Palpitations. ? Thumping or pounding in your chest. ? Shortness of breath. ? Unexplained weakness.  Keep a diary of your activities, such as walking, doing chores, and taking medicine. It is very important to note what you were doing when you pushed the button to record your symptoms. This will help your health care provider determine what might be contributing to your symptoms.  Send the recorded information as recommended by your health care provider. It may take some time for your health care provider to process the results.  Change the batteries as told by your health care provider.  Keep electronic devices away from your monitor. This includes: ? Tablets. ? MP3 players. ? Cell phones.  While wearing your monitor you should  avoid: ? Electric blankets. ? Firefighter. ? Electric toothbrushes. ? Microwave ovens. ? Magnets. ? Metal detectors. Get help right away if:  You have chest pain.  You have extreme difficulty breathing or shortness of breath.  You develop a very fast heartbeat that persists.  You develop dizziness that does not go away.  You faint or constantly feel like you are about to faint. Summary  A cardiac event monitor is a small recording device that is used to help detect abnormal heart rhythms (arrhythmias).  The monitor is used to record your heart rhythm when you have heart-related symptoms.  Make sure you understand how to send the information from the monitor to your health care provider.  It is important to press the button on the monitor when you have any heart-related symptoms.  Keep a  diary of your activities, such as walking, doing chores, and taking medicine. It is very important to note what you were doing when you pushed the button to record your symptoms. This will help your health care provider learn what might be causing your symptoms. This information is not intended to replace advice given to you by your health care provider. Make sure you discuss any questions you have with your health care provider. Document Released: 02/18/2008 Document Revised: 04/25/2016 Document Reviewed: 04/25/2016 Elsevier Interactive Patient Education  2017 [employer]   If you need a refill on your cardiac medications before your next appointment, please call your pharmacy.

## 2016-12-30 ENCOUNTER — Ambulatory Visit (HOSPITAL_COMMUNITY): Payer: BLUE CROSS/BLUE SHIELD | Attending: Cardiology

## 2016-12-30 ENCOUNTER — Ambulatory Visit (INDEPENDENT_AMBULATORY_CARE_PROVIDER_SITE_OTHER): Payer: BLUE CROSS/BLUE SHIELD

## 2016-12-30 ENCOUNTER — Other Ambulatory Visit: Payer: Self-pay

## 2016-12-30 ENCOUNTER — Ambulatory Visit (INDEPENDENT_AMBULATORY_CARE_PROVIDER_SITE_OTHER)
Admission: RE | Admit: 2016-12-30 | Discharge: 2016-12-30 | Disposition: A | Discharge status: Home / Self Care | Payer: BLUE CROSS/BLUE SHIELD | Source: Ambulatory Visit | Attending: Physician Assistant | Admitting: Physician Assistant

## 2016-12-30 DIAGNOSIS — R55 Syncope and collapse: Secondary | ICD-10-CM

## 2016-12-30 DIAGNOSIS — R519 Headache, unspecified: Secondary | ICD-10-CM

## 2016-12-30 DIAGNOSIS — I493 Ventricular premature depolarization: Secondary | ICD-10-CM

## 2016-12-30 DIAGNOSIS — R51 Headache: Secondary | ICD-10-CM

## 2017-01-15 ENCOUNTER — Telehealth: Payer: Self-pay | Admitting: Cardiology

## 2017-01-15 NOTE — Telephone Encounter (Signed)
Spoke with the pt and she states she DID NOT pass out, nor did she mean to hit the button.  Pt states at around 1055 this morning, she was vacuuming and cleaning the house.  Pt states she pressed the button a couple of days ago for feeling light headed, but did not mean to press it today. Pt states she has been asymptomatic today.  Informed the pt that I will forward this information to Dr Anne Fu and Kriste Basque as an Lorain Childes.

## 2017-01-15 NOTE — Telephone Encounter (Signed)
[employer] called and endorsed that at 1055 this morning, pt triggered her event monitor with complaint of having a syncopal episode.  Per Casimiro Needle at [facility], the pt was in sinus tachycardia at a HR of 121 bpm.  Casimiro Needle attempted to call the pt and left a detailed message to call back.  This was confirmed on the [employer] website that the pt did trigger this event today 8/24 at 1053 CT, showing sinus tach at a rate of 121 bpm. I also attempted to call the pt at contact numbers provided, and left her a detailed message to call the office back.  We will continue following up with the pt.

## 2017-01-15 NOTE — Telephone Encounter (Signed)
Thank you for your prompt attention to this, Lajoyce Corners!

## 2017-01-15 NOTE — Telephone Encounter (Signed)
New message:      Critical EKG 

## 2017-01-24 ENCOUNTER — Telehealth: Payer: Self-pay | Admitting: Cardiology

## 2017-01-24 NOTE — Telephone Encounter (Addendum)
[employer] called stating that patient had NSR with 13 beats of wide complex tachycardia.  Patient noted fluttering at home while walking but otherwise felt fine.  Strip reviewed and difficult to discern whether this is SVT with aberration vs. NSVT.  She does have a history of  PAT in the past.  Cannot discern AV dissociation or fusion beats that would indicate VT.  Will continue to monitor.  Recent echo with normal LVF.  Instructed to send report to office.

## 2017-01-26 NOTE — Telephone Encounter (Signed)
I would advise we schedule an appointment with EP ASAP to further evaluate this. Thanks! Dayna Dunn PA-C

## 2017-01-26 NOTE — Telephone Encounter (Signed)
Called pt to let her know that we are working on an appt with and EP doc for her heart monitor strip result that was called in to us. Left her a detailed message that Efraim KaufmannMelissa will be calling her to set this up and if she had any questions, to call me back.

## 2017-01-26 NOTE — Telephone Encounter (Signed)
Thanks for update °Mark Skains, MD ° °

## 2017-02-17 ENCOUNTER — Encounter: Payer: Self-pay | Admitting: Cardiology

## 2017-02-17 ENCOUNTER — Ambulatory Visit (INDEPENDENT_AMBULATORY_CARE_PROVIDER_SITE_OTHER): Payer: BLUE CROSS/BLUE SHIELD | Admitting: Cardiology

## 2017-02-17 VITALS — BP 128/84 | HR 88 | Ht 66.0 in | Wt 148.2 lb

## 2017-02-17 DIAGNOSIS — I472 Ventricular tachycardia, unspecified: Secondary | ICD-10-CM

## 2017-02-17 DIAGNOSIS — R000 Tachycardia, unspecified: Secondary | ICD-10-CM

## 2017-02-17 MED ORDER — DILTIAZEM HCL ER COATED BEADS 120 MG PO CP24: 120.0000 mg | ORAL_CAPSULE | Freq: Every day | ORAL | 3 refills | Status: DC

## 2017-02-17 NOTE — Patient Instructions (Signed)
Medication Instructions:  Your physician has recommended you make the following change in your medication:  1. START Diltiazem 120 mg once daily  -- If you need a refill on your cardiac medications before your next appointment, please call your pharmacy. --  Labwork: None ordered  Testing/Procedures: None ordered  Follow-Up: Your physician recommends that you schedule a follow-up appointment in: 3 months with Dr. Elberta Fortis.  Thank you for choosing [facility]!!   Dory Horn, RN [phone]  Any Other Special Instructions Will Be Listed Below (If Applicable).  Diltiazem tablets What is this medicine? DILTIAZEM (dil TYE a zem) is a calcium-channel blocker. It affects the amount of calcium found in your heart and muscle cells. This relaxes your blood vessels, which can reduce the amount of work the heart has to do. This medicine is used to treat chest pain caused by angina. This medicine may be used for other purposes; ask your health care provider or pharmacist if you have questions. COMMON BRAND NAME(S): Cardizem What should I tell my health care provider before I take this medicine? They need to know if you have any of these conditions: -heart problems, low blood pressure, irregular heartbeat -liver disease -previous heart attack -an unusual or allergic reaction to diltiazem, other medicines, foods, dyes, or preservatives -pregnant or trying to get pregnant -breast-feeding How should I use this medicine? Take this medicine by mouth with a glass of water. Follow the directions on the prescription label. Do not cut, crush or chew this medicine. This medicine is usually taken before meals and at bedtime. Take your doses at regular intervals. Do not take your medicine more often then directed. Do not stop taking except on the advice of your doctor or health care professional. Talk to your pediatrician regarding the use of this medicine in children. Special care may be  needed. Overdosage: If you think you have taken too much of this medicine contact a poison control center or emergency room at once. NOTE: This medicine is only for you. Do not share this medicine with others. What if I miss a dose? If you miss a dose, take it as soon as you can. If it is almost time for your next dose, take only that dose. Do not take double or extra doses. What may interact with this medicine? Do not take this medicine with any of the following: -cisapride -hawthorn -pimozide -ranolazine -red yeast rice This medicine may also interact with the following medications: -buspirone -carbamazepine -cimetidine -cyclosporine -digoxin -local anesthetics or general anesthetics -lovastatin -medicines for anxiety or difficulty sleeping like midazolam and triazolam -medicines for high blood pressure or heart problems -quinidine -rifampin, rifabutin, or rifapentine This list may not describe all possible interactions. Give your health care provider a list of all the medicines, herbs, non-prescription drugs, or dietary supplements you use. Also tell them if you smoke, drink alcohol, or use illegal drugs. Some items may interact with your medicine. What should I watch for while using this medicine? Check your blood pressure and pulse rate regularly. Ask your doctor or health care professional what your blood pressure and pulse rate should be and when you should contact him or her. You may feel dizzy or lightheaded. Do not drive, use machinery, or do anything that needs mental alertness until you know how this medicine affects you. To reduce the risk of dizzy or fainting spells, do not sit or stand up quickly, especially if you are an older patient. Alcohol can make you more dizzy  or increase flushing and rapid heartbeats. Avoid alcoholic drinks. What side effects may I notice from receiving this medicine? Side effects that you should report to your doctor or health care professional  as soon as possible: -allergic reactions like skin rash, itching or hives, swelling of the face, lips, or tongue -confusion, mental depression -feeling faint or lightheaded, falls -pinpoint red spots on the skin -redness, blistering, peeling or loosening of the skin, including inside the mouth -slow, irregular heartbeat -swelling of the ankles, feet -unusual bleeding or bruising Side effects that usually do not require medical attention (report to your doctor or health care professional if they continue or are bothersome): -change in sex drive or performance -constipation or diarrhea -flushing of the face -headache -nausea, vomiting -tired or weak -trouble sleeping This list may not describe all possible side effects. Call your doctor for medical advice about side effects. You may report side effects to FDA at 1-800-FDA-1088. Where should I keep my medicine? Keep out of the reach of children. Store at room temperature between 20 and 25 degrees C (68 and 77 degrees F). Protect from light. Keep container tightly closed. Throw away any unused medicine after the expiration date. NOTE: This sheet is a summary. It may not cover all possible information. If you have questions about this medicine, talk to your doctor, pharmacist, or health care provider.  2018 Elsevier/Gold Standard (2013-04-24 10:54:31)   Postural Orthostatic Tachycardia Syndrome Postural orthostatic tachycardia syndrome (POTS) is a group of symptoms that can occur when you stand up after lying down. POTS happens when less blood flows to the heart than normal after you stand up. The reduced blood flow to the heart makes the heart beat rapidly. POTS may be associated with another medical condition, or it may occur on its own. What are the causes? This cause of this condition is not known, but many conditions and diseases have been linked to it. What increases the risk? This condition is more likely to develop in:  Women  68-15 years old.  Women who are pregnant.  Women who are menstruating.  People who have certain conditions, including: ? A viral infection. ? An autoimmune disease. ? Anemia. ? Dehydration. ? Hyperthyroidism.  People who take certain medicines.  People who have had a major injury.  People who have had surgery.  What are the signs or symptoms? The most common symptom of this condition is lightheadedness after standing up from a lying down position. Other symptoms may include:  Feeling a rapid increase in the speed of the heartbeat (tachycardia) within 10 minutes of standing up.  Fainting.  Weakness.  Confusion.  Trembling.  Shortness of breath.  Sweating or flushing.  Headache.  Chest pain.  Breathing that is deeper and faster than normal (hyperventilation).  Nausea.  Anxiety.  Symptoms may be worse in the morning, and they may be relieved by lying down. How is this diagnosed? This condition is diagnosed based on:  Your symptoms.  Your medical history.  A physical exam.  Measurements of your heart rate when you are lying down and after you stand up.  A measurement of your blood pressure. The measurement will be taken when you go from lying down to standing up.  Blood tests to measure hormones that change with blood pressure. The blood tests will be done when you are lying down and standing up.  You may have other tests to check whether you have a condition or disease that is linked to POTS. How  is this treated? Treatment for this condition depends on how severe your symptoms are and whether you have any conditions or diseases that have been linked to POTS. Treatment may involve:  Treating any conditions or diseases that have been linked to POTS.  Drinking two glasses of water before getting up from a lying position.  Eating more salt (sodium).  Taking medicine to control blood pressure and heart rate (beta-blocker).  Taking medicines to control  blood flow, blood pressure, or heart rate.  Avoiding certain medicines.  Starting an exercise program under the supervision of your health care provider.  Follow these instructions at home:  Eating and drinking  Drink enough fluid to keep your urine clear or pale yellow.  If told by your health care provider, drink two glasses of water before getting up from a lying position.  Follow instructions from your health care provider about how much sodium you should eat.  Eat several small meals a day instead of a few large meals.  Avoid heavy meals. Medicines  Take over-the-counter and prescription medicines only as told by your health care provider.  Talk with your health care provider before starting any new medicines. Activity  Do an aerobic exercise for 20 minutes a day, at least 3 days a week.  Ask your health care provider what kinds of exercise are safe for you. Contact a health care provider if:  Your symptoms do not improve after treatment.  Your symptoms get worse.  You develop new symptoms. Get help right away if:  You have chest pain.  You have difficulty breathing.  You have fainting episodes. This information is not intended to replace advice given to you by your health care provider. Make sure you discuss any questions you have with your health care provider. Document Released: 05/01/2002 Document Revised: 06/19/2015 Document Reviewed: 11/23/2014 Elsevier Interactive Patient Education  [employer].

## 2017-02-17 NOTE — Progress Notes (Signed)
Electrophysiology Office Note   Date:  02/17/2017   ID:  Carla Griffin, DOB Carla Griffin, 1982, MRN [id number]  PCP:  Carla Nielsen, DO  Cardiologist:  Anne Fu Primary Electrophysiologist:  Carla Musa [name], MD    Chief Complaint  Patient presents with  . Advice Only    SVT  . Dizziness  . Shortness of Breath     History of Present Illness: Carla Griffin is a 36 y.o. female who is being seen today for the evaluation of PVCs at the request of Carla Nielsen, DO. Presenting today for electrophysiology evaluation. 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 She has a history of GERD, gallstones status post cholecystectomy, IBS, and PVCs. She had an episode of syncope. She saw her primary physician in July 2018 with an episode of syncope and a diagnosis of pots. She had orthostatic vital signs with elevated heart rates and reproduction of milder symptoms with postural changes. The beginning of 2018, and starting her job in of onset, she was standing up giving report when she felt hot, nauseated, weak and nearly passed out. Blood pressure was 70s over 40s. Symptoms recurred when she sat down. She has had more lightheadedness when standing.He has been increasing her salt and fluid intake. She says that this has made a minimal effect on her symptoms. She continues to have palpitations and dizziness. She has good days and bad days. Most recently while at church, with standing and sitting quite often, she did get dizzy and have palpitations.  Today, she denies symptoms of chest pain, shortness of breath, orthopnea, PND, lower extremity edema, claudication, bleeding, or neurologic sequela. The patient is tolerating medications without difficulties.    Past Medical History:  Diagnosis Date  . Gallstones   . GERD (gastroesophageal reflux disease)   . IBS (irritable bowel syndrome)   . Pneumonia   . Urinary tract infection    Past Surgical History:  Procedure Laterality Date  . BRAVO PH STUDY N/A 03/03/2016     Procedure: BRAVO PH STUDY;  Surgeon: Napoleon Form, MD;  Location: [facility];  Service: Endoscopy;  Laterality: N/A;  . CHOLECYSTECTOMY  2014  . ESOPHAGOGASTRODUODENOSCOPY (EGD) WITH PROPOFOL N/A 03/03/2016   Procedure: ESOPHAGOGASTRODUODENOSCOPY (EGD) WITH PROPOFOL;  Surgeon: Napoleon Form, MD;  Location: [facility];  Service: Endoscopy;  Laterality: N/A;  . WISDOM TOOTH EXTRACTION       Current Outpatient Prescriptions  Medication Sig Dispense Refill  . acetaminophen (TYLENOL) 500 MG tablet Take 500 mg by mouth daily as needed. Once daily if needed    . esomeprazole (NEXIUM) 40 MG capsule TAKE 1 CAPSULE (40 MG TOTAL) BY MOUTH 2 (TWO) TIMES DAILY BEFORE A MEAL. 60 capsule 3  . Multiple Vitamin (MULTIVITAMIN) tablet Take 1 tablet by mouth daily.    . norethindrone-ethinyl estradiol (JUNEL FE 1/20) 1-20 MG-MCG tablet Take 1 tablet by mouth daily. 1 Package 11  . Probiotic Product (ALIGN) 4 MG CAPS Take by mouth daily before breakfast. Take 1 capsule daily.     . ranitidine (ZANTAC) 150 MG tablet TAKE 0.5 MG TABLET BY MOUTH AS NEEDED FOR HEARTBURN    . diltiazem (CARDIZEM CD) 120 MG 24 hr capsule Take 1 capsule (120 mg total) by mouth daily. 90 capsule 3   No current facility-administered medications for this visit.     Allergies:   Patient has no known allergies.   Social History:  The patient  reports that she has never smoked. She has never used smokeless  tobacco. She reports that she drinks alcohol. She reports that she does not use drugs.   Family History:  The patient's family history includes Diabetes in her maternal grandfather; Heart attack in her maternal grandfather; Hyperlipidemia in her father; Hypertension in her father; Lupus in her mother; Stroke in her paternal grandfather.    ROS:  Please see the history of present illness.   Otherwise, review of systems is positive for Sweats, palpitations, dyspnea on exertion, dizziness, headaches.   All other systems  are reviewed and negative.    PHYSICAL EXAM: VS:  BP 128/84   Pulse 88   Ht  (1.676 m)   Wt 148 lb 3.2 oz (67.2 kg)   BMI 23.92 kg/m  , BMI Body mass index is 23.92 kg/m. GEN: Well nourished, well developed, in no acute distress  HEENT: normal  Neck: no JVD, carotid bruits, or masses Cardiac: RRR; no murmurs, rubs, or gallops,no edema  Respiratory:  clear to auscultation bilaterally, normal work of breathing GI: soft, nontender, nondistended, + BS MS: no deformity or atrophy  Skin: warm and dry Neuro:  Strength and sensation are intact Psych: euthymic mood, full affect  EKG:  EKG is not ordered today. Personal review of the ekg ordered 12/16/16 shows SR, rate 78  Recent Labs: 11/23/2016: ALT 14; BUN 11; Creat 0.86; Hemoglobin 13.2; Magnesium 1.9; Platelets 273; Potassium 4.2; Sodium 139; TSH 1.43    Lipid Panel     Component Value Date/Time   CHOL 171 08/16/2009 0916   TRIG 46.0 08/16/2009 0916   HDL 68.70 08/16/2009 0916   CHOLHDL 2 08/16/2009 0916   VLDL 9.2 08/16/2009 0916   LDLCALC 93 08/16/2009 0916     Wt Readings from Last 3 Encounters:  02/17/17 148 lb 3.2 oz (67.2 kg)  Griffin/25/18 146 lb 1.9 oz (66.3 kg)  Griffin/02/18 142 lb (64.4 kg)      Other studies Reviewed: Additional studies/ records that were reviewed today include: TTE 12/30/16  Review of the above records today demonstrates:  - Left ventricle: The cavity size was normal. Systolic function was   normal. The estimated ejection fraction was in the range of 60%   to 65%. Wall motion was normal; there were no regional wall   motion abnormalities. Left ventricular diastolic function   parameters were normal. - Aortic valve: Trileaflet; normal thickness leaflets. There was no   regurgitation. - Aortic root: The aortic root was normal in size. - Mitral valve: Valve area by pressure half-time: 2.29 cm^2. - Left atrium: The atrium was normal in size. - Right ventricle: Systolic function was normal. -  Right atrium: The atrium was normal in size. - Tricuspid valve: There was trivial regurgitation. - Pulmonary arteries: Systolic pressure was within the normal   range. - Inferior vena cava: The vessel was normal in size. The   respirophasic diameter changes were in the normal range (= 50%),   consistent with normal central venous pressure. - Pericardium, extracardiac: There was no pericardial effusion.  Monitor 02/03/17 - personally reviewed  On 01-24-17 at 4:14 PM patient experienced 13 beats of ventricular tachycardia - she experienced flutter sensation. Did not have syncope with this episode.  She did experience syncope at times during her event monitor, all of which correlated with sinus rhythm/sinus tachycardia  No atrial fibrillation, no pauses. Very rare PVC  ASSESSMENT AND PLAN:  1.  Syncope: Signs of syncope and shown on her monitor. She has had symptoms consistent with pots, there was  not orthostatic at her last visit. She has been trying to increase fluid intake and liberalizing sodium. I've also told her to increase her exercise. Due to her palpitations and possible IST, Terrence Wishon plan to start her on diltiazem 120 mg daily. We Kelle Ruppert also give her information today on pots and IST.  2. Nonsustained VT: Currently her ejection fraction is normal without other abnormalities on her echo. She has had one episode. If she has more would potentially consider antiarrhythmic medications. Planning to start diltiazem.   Current medicines are reviewed at length with the patient today.   The patient does not have concerns regarding her medicines.  The following changes were made today:  diltiazem  Labs/ tests ordered today include:  No orders of the defined types were placed in this encounter.    Disposition:   FU with Emree Locicero 3 months  Signed, Jaisen Wiltrout [name], MD  02/17/2017 8:54 AM     [facility] [address] [place] [address] [phone]  (office) [phone] (fax)

## 2017-02-19 ENCOUNTER — Other Ambulatory Visit: Payer: Self-pay | Admitting: *Deleted

## 2017-02-19 MED ORDER — ESOMEPRAZOLE MAGNESIUM 40 MG PO CPDR: 40.0000 mg | DELAYED_RELEASE_CAPSULE | Freq: Two times a day (BID) | ORAL | 3 refills | Status: AC

## 2017-02-19 NOTE — Telephone Encounter (Signed)
Refill request for nexium sent to pharmacy today

## 2017-03-02 ENCOUNTER — Ambulatory Visit: Payer: BLUE CROSS/BLUE SHIELD | Admitting: Cardiology

## 2017-03-31 ENCOUNTER — Other Ambulatory Visit: Payer: Self-pay | Admitting: Obstetrics and Gynecology

## 2017-03-31 DIAGNOSIS — Z3041 Encounter for surveillance of contraceptive pills: Secondary | ICD-10-CM

## 2017-03-31 DIAGNOSIS — N951 Menopausal and female climacteric states: Secondary | ICD-10-CM

## 2017-03-31 NOTE — Telephone Encounter (Signed)
Refill on [facility] due to insurance needs.

## 2017-04-12 ENCOUNTER — Telehealth: Payer: Self-pay

## 2017-04-12 ENCOUNTER — Ambulatory Visit: Payer: BLUE CROSS/BLUE SHIELD | Admitting: Osteopathic Medicine

## 2017-04-12 DIAGNOSIS — J029 Acute pharyngitis, unspecified: Secondary | ICD-10-CM | POA: Diagnosis not present

## 2017-04-12 LAB — POCT RAPID STREP A (OFFICE): RAPID STREP A SCREEN: NEGATIVE

## 2017-04-12 MED ORDER — IPRATROPIUM BROMIDE 0.06 % NA SOLN: 2.0000 | Freq: Four times a day (QID) | NASAL | 1 refills | Status: DC

## 2017-04-12 MED ORDER — AMOXICILLIN-POT CLAVULANATE 875-125 MG PO TABS: 1.0000 | ORAL_TABLET | Freq: Two times a day (BID) | ORAL | 0 refills | Status: DC

## 2017-04-12 NOTE — Progress Notes (Signed)
error 

## 2017-04-12 NOTE — Telephone Encounter (Signed)
Patient advised of recommendations.  

## 2017-04-12 NOTE — Patient Instructions (Signed)
Plan: Nasal spray for sinus drainage and ear pressure Antibiotics for presumes strep infection - if culture negative can stop the antibiotics

## 2017-04-12 NOTE — Telephone Encounter (Signed)
Patient has been scheduled for this afternoon. She wanted to know if something could be called in instead of having to come in. She has a sore throat, no fever, chills or sweats. Her son is positive for strep. Please advise.

## 2017-04-12 NOTE — Telephone Encounter (Signed)
I do not Rx antibiotics for anyone without a physical exam. She will need to come in if she has questions about whether she needs antibiotics.

## 2017-04-12 NOTE — Progress Notes (Signed)
HPI: Carla Griffin is a 36 y.o. female who  has a past medical history of Gallstones, GERD (gastroesophageal reflux disease), IBS (irritable bowel syndrome), Pneumonia, and Urinary tract infection.  she presents to [facility] today, 04/12/17,  for chief complaint of:  Chief Complaint  Patient presents with  . Sore Throat    Center for the past few days, accompanied by mild cough, subjective fever but no really elevated temperature, fatigue, some sinus pressure/congestion and some ear pressure on the right side. Son was recently diagnosed this morning at pediatrician with strep   Past medical, surgical, social and family history reviewed:  Patient Active Problem List   Diagnosis Date Noted  . Postural orthostatic tachycardia syndrome 11/23/2016  . Gynecologic exam normal 06/23/2016  . Contraception management 06/23/2016  . Perimenopausal vasomotor symptoms 06/23/2016  . Heartburn   . Dyspepsia   . Left sided abdominal pain 01/08/2016  . Nausea without vomiting 01/08/2016  . Loose stools 01/08/2016  . Generalized abdominal cramps 01/08/2016  . Early satiety 01/08/2016  . Swallowing difficulty 01/08/2016  . Elevated ALT measurement 01/08/2016  . Palpitations 08/14/2015  . Gastroesophageal reflux disease without esophagitis 08/14/2015  . Left-sided thoracic back pain 04/29/2015  . Fatigue 04/29/2015  . UNSPECIFIED VITAMIN D DEFICIENCY 05/06/2009  . BACK PAIN WITH RADICULOPATHY 02/15/2009    Past Surgical History:  Procedure Laterality Date  . BRAVO PH STUDY N/A 03/03/2016   Performed by Napoleon FormNandigam, Kavitha V[facility]  . CHOLECYSTECTOMY  2014  . ESOPHAGOGASTRODUODENOSCOPY (EGD) WITH PROPOFOL N/A 03/03/2016   Performed by Napoleon FormNandigam, Kavitha V, [facility]  . WISDOM TOOTH EXTRACTION      Social History   Tobacco Use  . Smoking status: Never Smoker  . Smokeless tobacco: Never Used  Substance Use Topics  . Alcohol use: Yes     Alcohol/week: 0.0 oz    Comment: occasional    Family History  Problem Relation Age of Onset  . Lupus Mother   . Hyperlipidemia Father   . Hypertension Father   . Diabetes Maternal Grandfather   . Heart attack Maternal Grandfather   . Stroke Paternal Grandfather      Current medication list and allergy/intolerance information reviewed:    Current Outpatient Medications  Medication Sig Dispense Refill  . acetaminophen (TYLENOL) 500 MG tablet Take 500 mg by mouth daily as needed. Once daily if needed    . diltiazem (CARDIZEM CD) 120 MG 24 hr capsule Take 1 capsule (120 mg total) by mouth daily. 90 capsule 3  . esomeprazole (NEXIUM) 40 MG capsule Take 1 capsule (40 mg total) by mouth 2 (two) times daily before a meal. 180 capsule 3  . JUNEL FE 1/20 1-20 MG-MCG tablet TAKE 1 TABLET BY MOUTH DAILY. 84 tablet 1  . Multiple Vitamin (MULTIVITAMIN) tablet Take 1 tablet by mouth daily.    . Probiotic Product (ALIGN) 4 MG CAPS Take by mouth daily before breakfast. Take 1 capsule daily.     . ranitidine (ZANTAC) 150 MG tablet TAKE 0.5 MG TABLET BY MOUTH AS NEEDED FOR HEARTBURN     No current facility-administered medications for this visit.     No Known Allergies    Review of Systems:  Constitutional:  +fever, +chills, +recent illness, No unintentional weight changes. +significant fatigue.   HEENT: No  headache, no vision change, no hearing change, +sore throat, +sinus pressure  Cardiac: No  chest pain, No  pressure, No palpitations,  Respiratory:  No  shortness of breath. +minimal Cough  Gastrointestinal: No  abdominal pain, No  nausea, No  vomiting,  No  blood in stool, No  diarrhea, No  constipation   Musculoskeletal: No new myalgia/arthralgia  Skin: No  Rash,   Exam:   Constitutional: VS afebrile, normotensive. General Appearance: alert, well-developed, well-nourished, NAD  Eyes: Normal lids and conjunctive, non-icteric sclera  Ears, Nose, Mouth, Throat: MMM, Normal  external inspection ears/nares/mouth/lips/gums. TM normal bilaterally. Pharynx/tonsils +erythema, +small area of exudate on L tonsil. Nasal mucosa normal.   Neck: No masses, trachea midline. No thyroid enlargement. No tenderness/mass appreciated. No lymphadenopathy  Respiratory: Normal respiratory effort. no wheeze, no rhonchi, no rales  Cardiovascular: S1/S2 normal, no murmur, no rub/gallop auscultated. RRR. No lower extremity edema  Musculoskeletal: Gait normal. coordination.   Skin: warm, dry, intact. No rash/ulcer.   Psychiatric: Normal judgment/insight. Normal mood and affect. Oriented x3.    Results for orders placed or performed in visit on 04/12/17 (from the past 72 hour(s))  POCT rapid strep A     Status: Normal   Collection Time: 04/12/17  4:28 PM  Result Value Ref Range   Rapid Strep A Screen Negative Negative   MODIFIED CENTOR CRITERIA (ponts if "yes"): Tonsillar exudate (1): yes Tender Ant Cervical LN (1): no Absence of cough (1): yes Fever (1): no Age  27-14 (1): no 15-45 (0): yes >/= 45 (-1): no SCORE: 2 TREATMENT: -1,0,1 = SUPPORTIVE CARE 2 - 3 = TEST, TX (+)SWAB, CX (-)SWAB 4 - 5 = TX  Results for orders placed or performed in visit on 04/12/17 (from the past 72 hour(s))  POCT rapid strep A     Status: Normal   Collection Time: 04/12/17  4:28 PM  Result Value Ref Range   Rapid Strep A Screen Negative Negative  Culture, Group A Strep     Status: None   Collection Time: 04/12/17  4:29 PM  Result Value Ref Range   MICRO NUMBER: [id number]    SPECIMEN QUALITY: ADEQUATE    SOURCE: THROAT    STATUS: FINAL    RESULT: No group A Streptococcus isolated      ASSESSMENT/PLAN:   Sore throat - Plan: POCT rapid strep A, Culture, Group A Strep    Patient Instructions  Plan: Nasal spray for sinus drainage and ear pressure Antibiotics for presumes strep infection - if culture negative can stop the antibiotics     Visit summary with medication list and  pertinent instructions was printed for patient to review. All questions at time of visit were answered - patient instructed to contact office with any additional concerns. ER/RTC precautions were reviewed with the patient. Follow-up plan: Return if symptoms worsen or fail to improve.   Please note: voice recognition software was used to produce this document, and typos may escape review. Please contact Dr. Lyn HollingsheadAlexander for any needed clarifications.

## 2017-04-14 ENCOUNTER — Encounter: Payer: Self-pay | Admitting: Osteopathic Medicine

## 2017-04-14 LAB — CULTURE, GROUP A STREP
MICRO NUMBER:: 81302974
SPECIMEN QUALITY:: ADEQUATE

## 2017-04-16 ENCOUNTER — Encounter: Payer: Self-pay | Admitting: Emergency Medicine

## 2017-04-16 ENCOUNTER — Emergency Department
Admission: EM | Admit: 2017-04-16 | Discharge: 2017-04-16 | Disposition: A | Discharge status: Home / Self Care | Payer: BLUE CROSS/BLUE SHIELD | Source: Home / Self Care | Attending: Family Medicine | Admitting: Family Medicine

## 2017-04-16 DIAGNOSIS — B309 Viral conjunctivitis, unspecified: Secondary | ICD-10-CM

## 2017-04-16 DIAGNOSIS — J039 Acute tonsillitis, unspecified: Secondary | ICD-10-CM

## 2017-04-16 DIAGNOSIS — J069 Acute upper respiratory infection, unspecified: Secondary | ICD-10-CM

## 2017-04-16 MED ORDER — CARBOXYMETHYLCELLULOSE SODIUM 1 % OP SOLN: 1.0000 [drp] | Freq: Three times a day (TID) | OPHTHALMIC | 1 refills | Status: DC

## 2017-04-16 NOTE — ED Provider Notes (Signed)
[facility]    CSN: [id number] Arrival date & time: 04/16/17  [id number]     History   Chief Complaint Chief Complaint  Patient presents with  . URI    HPI Carla Griffin is a 36 y.o. female.   HPI  Carla Griffin is a 36 y.o. female presenting to UC with c/o 5 days of continued sore throat, low grade fever, and now developed bilateral eye redness, irritation and crusting since last night.  She was seen by her PCP on Monday, tested Negative for Strep throat, however, her son tested positive. Pt was prescribed Augmentin due to multiple sick family members in household.  She has been taking the medication since Monday w/o relief.  She does get temporary relief from fever and pain with Tylenol and Motrin. Denies n/v/d.   Past Medical History:  Diagnosis Date  . Gallstones   . GERD (gastroesophageal reflux disease)   . IBS (irritable bowel syndrome)   . Pneumonia   . Urinary tract infection     Patient Active Problem List   Diagnosis Date Noted  . Postural orthostatic tachycardia syndrome 11/23/2016  . Gynecologic exam normal 06/23/2016  . Contraception management 06/23/2016  . Perimenopausal vasomotor symptoms 06/23/2016  . Heartburn   . Dyspepsia   . Left sided abdominal pain 01/08/2016  . Nausea without vomiting 01/08/2016  . Loose stools 01/08/2016  . Generalized abdominal cramps 01/08/2016  . Early satiety 01/08/2016  . Swallowing difficulty 01/08/2016  . Elevated ALT measurement 01/08/2016  . Palpitations 08/14/2015  . Gastroesophageal reflux disease without esophagitis 08/14/2015  . Left-sided thoracic back pain 04/29/2015  . Fatigue 04/29/2015  . UNSPECIFIED VITAMIN D DEFICIENCY 05/06/2009  . BACK PAIN WITH RADICULOPATHY 02/15/2009    Past Surgical History:  Procedure Laterality Date  . BRAVO PH STUDY N/A 03/03/2016   Procedure: BRAVO PH STUDY;  Surgeon: Napoleon FormKavitha V Nandigam, MD;  Location: [facility];  Service: Endoscopy;  Laterality: N/A;  .  CHOLECYSTECTOMY  2014  . ESOPHAGOGASTRODUODENOSCOPY (EGD) WITH PROPOFOL N/A 03/03/2016   Procedure: ESOPHAGOGASTRODUODENOSCOPY (EGD) WITH PROPOFOL;  Surgeon: Napoleon FormKavitha V Nandigam, MD;  Location: [facility];  Service: Endoscopy;  Laterality: N/A;  . WISDOM TOOTH EXTRACTION      OB History    Gravida Para Term Preterm AB Living   2 2 2     2    SAB TAB Ectopic Multiple Live Births                   Home Medications    Prior to Admission medications   Medication Sig Start Date End Date Taking? Authorizing Provider  acetaminophen (TYLENOL) 500 MG tablet Take 500 mg by mouth daily as needed. Once daily if needed    [provider]  amoxicillin-clavulanate (AUGMENTIN) 875-125 MG tablet Take 1 tablet 2 (two) times daily by mouth. 04/12/17   Sunnie NielsenAlexander, Natalie, DO  carboxymethylcellulose 1 % ophthalmic solution Apply 1 drop to eye 3 (three) times daily. 04/16/17   Lurene ShadowPhelps, Mardi Cannady O, PA-C  diltiazem (CARDIZEM CD) 120 MG 24 hr capsule Take 1 capsule (120 mg total) by mouth daily. 02/17/17 05/18/17  Leone BrandIngold, Laura R, NP  esomeprazole (NEXIUM) 40 MG capsule Take 1 capsule (40 mg total) by mouth 2 (two) times daily before a meal. 02/19/17   Nandigam, Eleonore ChiquitoKavitha V, MD  ipratropium (ATROVENT) 0.06 % nasal spray Place 2 sprays 4 (four) times daily into both nostrils. 04/12/17   Sunnie NielsenAlexander, Natalie, DO  JUNEL FE 1/20 1-20 MG-MCG  tablet TAKE 1 TABLET BY MOUTH DAILY. 03/31/17   Hermina Staggers, MD  Multiple Vitamin (MULTIVITAMIN) tablet Take 1 tablet by mouth daily.    [provider]  Probiotic Product (ALIGN) 4 MG CAPS Take by mouth daily before breakfast. Take 1 capsule daily.     [provider]  ranitidine (ZANTAC) 150 MG tablet TAKE 0.5 MG TABLET BY MOUTH AS NEEDED FOR HEARTBURN    [provider]    Family History Family History  Problem Relation Age of Onset  . Lupus Mother   . Hyperlipidemia Father   . Hypertension Father   . Diabetes Maternal Grandfather   .  Heart attack Maternal Grandfather   . Stroke Paternal Grandfather     Social History Social History   Tobacco Use  . Smoking status: Never Smoker  . Smokeless tobacco: Never Used  Substance Use Topics  . Alcohol use: Yes    Alcohol/week: 0.0 oz    Comment: occasional  . Drug use: No     Allergies   Patient has no known allergies.   Review of Systems Review of Systems  Constitutional: Positive for fever. Negative for chills.  HENT: Positive for congestion, ear pain, postnasal drip, rhinorrhea and sore throat. Negative for trouble swallowing and voice change.   Respiratory: Positive for cough. Negative for shortness of breath.   Cardiovascular: Negative for chest pain and palpitations.  Gastrointestinal: Negative for abdominal pain, diarrhea, nausea and vomiting.  Musculoskeletal: Negative for arthralgias, back pain and myalgias.  Skin: Negative for rash.  Neurological: Positive for headaches. Negative for dizziness and light-headedness.     Physical Exam Triage Vital Signs ED Triage Vitals  Enc Vitals Group     BP      Pulse      Resp      Temp      Temp src      SpO2      Weight      Height      Head Circumference      Peak Flow      Pain Score      Pain Loc      Pain Edu?      Excl. in GC?    No data found.  Updated Vital Signs BP 119/82 (BP Location: Right Arm)   Pulse (!) 105   Temp 98.6 F (37 C) (Oral)   Ht 5\' 6"  (1.676 m)   Wt 141 lb (64 kg)   BMI 22.76 kg/m     Physical Exam  Constitutional: She is oriented to person, place, and time. She appears well-developed and well-nourished. No distress.  HENT:  Head: Normocephalic and atraumatic.  Right Ear: Tympanic membrane normal.  Left Ear: Tympanic membrane normal.  Nose: Nose normal. Right sinus exhibits no maxillary sinus tenderness and no frontal sinus tenderness. Left sinus exhibits no maxillary sinus tenderness and no frontal sinus tenderness.  Mouth/Throat: Uvula is midline and mucous  membranes are normal. Oropharyngeal exudate, posterior oropharyngeal edema and posterior oropharyngeal erythema present. No tonsillar abscesses.  Eyes: EOM and lids are normal. Right eye exhibits no chemosis, no discharge, no exudate and no hordeolum. No foreign body present in the right eye. Left eye exhibits no chemosis, no discharge, no exudate and no hordeolum. No foreign body present in the left eye. Right conjunctiva is injected. Left conjunctiva is injected.  Neck: Normal range of motion.  Cardiovascular: Normal rate and regular rhythm.  Slight tachycardia in triage, normal rate on  exam.  Pulmonary/Chest: Effort normal and breath sounds normal. No stridor. No respiratory distress. She has no wheezes. She has no rales.  Musculoskeletal: Normal range of motion.  Neurological: She is alert and oriented to person, place, and time.  Skin: Skin is warm and dry. She is not diaphoretic.  Psychiatric: She has a normal mood and affect. Her behavior is normal.  Nursing note and vitals reviewed.    UC Treatments / Results  Labs (all labs ordered are listed, but only abnormal results are displayed) Labs Reviewed - No data to display  EKG  EKG Interpretation None       Radiology No results found.  Procedures Procedures (including critical care time)  Medications Ordered in UC Medications - No data to display   Initial Impression / Assessment and Plan / UC Course  I have reviewed the triage vital signs and the nursing notes.  Pertinent labs & imaging results that were available during my care of the patient were reviewed by me and considered in my medical decision making (see chart for details).     Due to exudative tonsillitis and persistent symptoms, pt may continue her Augmentin if not having side effects, however, symptoms are likely viral. Eye exam c/w viral conjunctivitis Encouraged symptomatic treatment.  F/u with PCP in 4-5 days if not improving, sooner if significantly  worsening.  Final Clinical Impressions(s) / UC Diagnoses   Final diagnoses:  Upper respiratory tract infection, unspecified type  Exudative tonsillitis  Viral conjunctivitis of both eyes    ED Discharge Orders        Ordered    carboxymethylcellulose 1 % ophthalmic solution  3 times daily     04/16/17 0914       Controlled Substance Prescriptions [place] Controlled Substance Registry consulted? Not Applicable   Rolla Platehelps, Ylonda Storr O, PA-C 04/16/17 [id number]

## 2017-04-16 NOTE — ED Triage Notes (Signed)
Fever, sore throat, eyes, hurting and crusty. Fever and sore throat x 5 days, her whole family has been sick, son has strep her strep was negative but she has been takin augmentin

## 2017-04-26 ENCOUNTER — Ambulatory Visit: Payer: BLUE CROSS/BLUE SHIELD | Admitting: Osteopathic Medicine

## 2017-04-26 ENCOUNTER — Encounter: Payer: Self-pay | Admitting: Osteopathic Medicine

## 2017-04-26 VITALS — BP 93/64 | HR 89 | Temp 98.1°F

## 2017-04-26 DIAGNOSIS — H1033 Unspecified acute conjunctivitis, bilateral: Secondary | ICD-10-CM

## 2017-04-26 MED ORDER — ARTIFICIAL TEARS OPHTHALMIC OINT: TOPICAL_OINTMENT | Freq: Three times a day (TID) | OPHTHALMIC | 1 refills | Status: DC

## 2017-04-26 MED ORDER — ERYTHROMYCIN 5 MG/GM OP OINT: TOPICAL_OINTMENT | OPHTHALMIC | 0 refills | Status: DC

## 2017-04-26 NOTE — Patient Instructions (Signed)
Plan:  Stop OTC drops  Start antibiotic for eye  Keep lubricating ointment on hand as needed for dry eye   If worse/change, or no better, call and I can place a referral to ophthalmologist

## 2017-04-26 NOTE — Progress Notes (Signed)
HPI: Carla Griffin is a 36 y.o. female who  has a past medical history of Gallstones, GERD (gastroesophageal reflux disease), IBS (irritable bowel syndrome), Pneumonia, and Urinary tract infection.  [facility] today, 04/26/17,  for chief complaint of:  Chief Complaint  Patient presents with  . Conjunctivitis    Recent viral URI, kids (+)strep but she was (-) Rx Augmentin d/t multiple sick family members in case worse, went to UC few days after saw me, I reviewed those notes from 04/16/17. now overall a bit better w/ respiratory symptoms having resolved, but has developed "gunky eye" better and worse few weeks, [facility], likely viral, has been more red for about a week now. Using OTC anti-redness drpos daily for about a week  BP a bit elevated at intake today, has been normal range in the past. OK on recheck.    Past medical, surgical, social and family history reviewed:  Patient Active Problem List   Diagnosis Date Noted  . Postural orthostatic tachycardia syndrome 11/23/2016  . Gynecologic exam normal 06/23/2016  . Contraception management 06/23/2016  . Perimenopausal vasomotor symptoms 06/23/2016  . Heartburn   . Dyspepsia   . Left sided abdominal pain 01/08/2016  . Nausea without vomiting 01/08/2016  . Loose stools 01/08/2016  . Generalized abdominal cramps 01/08/2016  . Early satiety 01/08/2016  . Swallowing difficulty 01/08/2016  . Elevated ALT measurement 01/08/2016  . Palpitations 08/14/2015  . Gastroesophageal reflux disease without esophagitis 08/14/2015  . Left-sided thoracic back pain 04/29/2015  . Fatigue 04/29/2015  . UNSPECIFIED VITAMIN D DEFICIENCY 05/06/2009  . BACK PAIN WITH RADICULOPATHY 02/15/2009    Past Surgical History:  Procedure Laterality Date  . BRAVO PH STUDY N/A 03/03/2016   Procedure: BRAVO PH STUDY;  Surgeon: Napoleon FormKavitha V Nandigam, MD;  Location: [facility];  Service:  Endoscopy;  Laterality: N/A;  . CHOLECYSTECTOMY  2014  . ESOPHAGOGASTRODUODENOSCOPY (EGD) WITH PROPOFOL N/A 03/03/2016   Procedure: ESOPHAGOGASTRODUODENOSCOPY (EGD) WITH PROPOFOL;  Surgeon: Napoleon FormKavitha V Nandigam, MD;  Location: [facility];  Service: Endoscopy;  Laterality: N/A;  . WISDOM TOOTH EXTRACTION      Social History   Tobacco Use  . Smoking status: Never Smoker  . Smokeless tobacco: Never Used  Substance Use Topics  . Alcohol use: Yes    Alcohol/week: 0.0 oz    Comment: occasional    Family History  Problem Relation Age of Onset  . Lupus Mother   . Hyperlipidemia Father   . Hypertension Father   . Diabetes Maternal Grandfather   . Heart attack Maternal Grandfather   . Stroke Paternal Grandfather      Current medication list and allergy/intolerance information reviewed:    Current Outpatient Medications  Medication Sig Dispense Refill  . acetaminophen (TYLENOL) 500 MG tablet Take 500 mg by mouth daily as needed. Once daily if needed    . carboxymethylcellulose 1 % ophthalmic solution Apply 1 drop to eye 3 (three) times daily. 30 mL 1  . diltiazem (CARDIZEM CD) 120 MG 24 hr capsule Take 1 capsule (120 mg total) by mouth daily. 90 capsule 3  . esomeprazole (NEXIUM) 40 MG capsule Take 1 capsule (40 mg total) by mouth 2 (two) times daily before a meal. 180 capsule 3  . ipratropium (ATROVENT) 0.06 % nasal spray Place 2 sprays 4 (four) times daily into both nostrils. 15 mL 1  . JUNEL FE 1/20 1-20 MG-MCG tablet TAKE 1 TABLET BY  MOUTH DAILY. 84 tablet 1  . Multiple Vitamin (MULTIVITAMIN) tablet Take 1 tablet by mouth daily.    . Probiotic Product (ALIGN) 4 MG CAPS Take by mouth daily before breakfast. Take 1 capsule daily.     . ranitidine (ZANTAC) 150 MG tablet TAKE 0.5 MG TABLET BY MOUTH AS NEEDED FOR HEARTBURN     No current facility-administered medications for this visit.     No Known Allergies    Review of Systems:  Constitutional:  No  fever, no chills, +recent  illness as per HPI, No unintentional weight changes. No significant fatigue.   HEENT: No  headache, no vision change, no hearing change, No sore throat, No  sinus pressure. EYe redness asper HPI   Cardiac: No  chest pain  Respiratory:  No  shortness of breath. No  Cough  Skin: No  Rash,   Exam:  BP (!) 146/85   Pulse 94   Temp 98.1 F (36.7 C) (Oral)   Constitutional: VS see above. General Appearance: alert, well-developed, well-nourished, NAD  Eyes: Normal lids, non-icteric sclera, mild conjunctival injection bilaterally, no discharge. EOMI without pain, PERRLA.   Ears, Nose, Mouth, Throat: MMM, Normal external inspection ears/nares/mouth/lips/gums.   Neck: No masses, trachea midline.  Respiratory: Normal respiratory effort.  Skin: warm, dry, intact. No rash/ulcer.      ASSESSMENT/PLAN:   Acute conjunctivitis of both eyes, unspecified acute conjunctivitis type   Meds ordered this encounter  Medications  . erythromycin ophthalmic ointment    Sig: Apply 1 inch ribbon to affected eye TID for 5 days.    Dispense:  3.5 g    Refill:  0  . artificial tears (LACRILUBE) OINT ophthalmic ointment    Sig: Place into both eyes 3 (three) times daily.    Dispense:  3.5 g    Refill:  1    Patient Instructions  Plan:  Stop OTC drops  Start antibiotic for eye  Keep lubricating ointment on hand as needed for dry eye   If worse/change, or no better, call and I can place a referral to ophthalmologist     Visit summary with medication list and pertinent instructions was printed for patient to review. All questions at time of visit were answered - patient instructed to contact office with any additional concerns. ER/RTC precautions were reviewed with the patient. Follow-up plan: Return if symptoms worsen or fail to improve.   Please note: voice recognition software was used to produce this document, and typos may escape review. Please contact Dr. Lyn HollingsheadAlexander for any needed  clarifications.

## 2017-05-04 ENCOUNTER — Ambulatory Visit: Payer: BLUE CROSS/BLUE SHIELD | Admitting: Cardiology

## 2017-06-13 ENCOUNTER — Encounter (HOSPITAL_COMMUNITY): Payer: Self-pay | Admitting: *Deleted

## 2017-06-13 ENCOUNTER — Other Ambulatory Visit: Payer: Self-pay

## 2017-06-13 ENCOUNTER — Emergency Department (HOSPITAL_COMMUNITY)
Admission: EM | Admit: 2017-06-13 | Discharge: 2017-06-13 | Disposition: A | Discharge status: Home / Self Care | Payer: BLUE CROSS/BLUE SHIELD | Attending: Emergency Medicine | Admitting: Emergency Medicine

## 2017-06-13 ENCOUNTER — Telehealth: Payer: Self-pay | Admitting: Cardiology

## 2017-06-13 ENCOUNTER — Emergency Department (HOSPITAL_COMMUNITY): Payer: BLUE CROSS/BLUE SHIELD

## 2017-06-13 DIAGNOSIS — R0789 Other chest pain: Secondary | ICD-10-CM | POA: Insufficient documentation

## 2017-06-13 DIAGNOSIS — R000 Tachycardia, unspecified: Secondary | ICD-10-CM

## 2017-06-13 DIAGNOSIS — K5900 Constipation, unspecified: Secondary | ICD-10-CM | POA: Diagnosis not present

## 2017-06-13 DIAGNOSIS — R42 Dizziness and giddiness: Secondary | ICD-10-CM | POA: Insufficient documentation

## 2017-06-13 DIAGNOSIS — R110 Nausea: Secondary | ICD-10-CM | POA: Insufficient documentation

## 2017-06-13 DIAGNOSIS — R0609 Other forms of dyspnea: Secondary | ICD-10-CM | POA: Insufficient documentation

## 2017-06-13 DIAGNOSIS — R002 Palpitations: Secondary | ICD-10-CM | POA: Insufficient documentation

## 2017-06-13 DIAGNOSIS — Z79899 Other long term (current) drug therapy: Secondary | ICD-10-CM | POA: Insufficient documentation

## 2017-06-13 DIAGNOSIS — R079 Chest pain, unspecified: Secondary | ICD-10-CM | POA: Diagnosis not present

## 2017-06-13 HISTORY — DX: Orthostatic hypotension: I95.1

## 2017-06-13 HISTORY — DX: Other specified cardiac arrhythmias: I49.8

## 2017-06-13 HISTORY — DX: Tachycardia, unspecified: R00.0

## 2017-06-13 HISTORY — DX: Postural orthostatic tachycardia syndrome (POTS): G90.A

## 2017-06-13 LAB — I-STAT TROPONIN, ED: Troponin i, poc: 0.01 ng/mL (ref 0.00–0.08)

## 2017-06-13 LAB — CBC
HCT: 39 % (ref 36.0–46.0)
HEMOGLOBIN: 13 g/dL (ref 12.0–15.0)
MCH: 30.7 pg (ref 26.0–34.0)
MCHC: 33.3 g/dL (ref 30.0–36.0)
MCV: 92 fL (ref 78.0–100.0)
Platelets: 207 10*3/uL (ref 150–400)
RBC: 4.24 MIL/uL (ref 3.87–5.11)
RDW: 13.1 % (ref 11.5–15.5)
WBC: 4.5 10*3/uL (ref 4.0–10.5)

## 2017-06-13 LAB — I-STAT BETA HCG BLOOD, ED (MC, WL, AP ONLY): I-stat hCG, quantitative: <5 m[IU]/mL (ref <5)

## 2017-06-13 LAB — BASIC METABOLIC PANEL
Anion gap: 10 (ref 5–15)
BUN: 12 mg/dL (ref 6–20)
CO2: 22 mmol/L (ref 22–32)
Calcium: 9.2 mg/dL (ref 8.9–10.3)
Chloride: 106 mmol/L (ref 101–111)
Creatinine, Ser: 0.81 mg/dL (ref 0.44–1.00)
GFR calc Af Amer: >60 mL/min (ref >60)
GLUCOSE: 103 mg/dL — AB (ref 65–99)
POTASSIUM: 3.8 mmol/L (ref 3.5–5.1)
Sodium: 138 mmol/L (ref 135–145)

## 2017-06-13 LAB — D-DIMER, QUANTITATIVE (NOT AT ARMC)

## 2017-06-13 MED ORDER — SODIUM CHLORIDE 0.9 % IV BOLUS (SEPSIS)
1000.0000 mL | Freq: Once | INTRAVENOUS | Status: AC
  Administered 2017-06-13: 1000 mL via INTRAVENOUS

## 2017-06-13 NOTE — Telephone Encounter (Signed)
Pt called on call answering service with complaints of palpitations which woke her from her sleep this morning.  She denies chest pain or shortness of breath however, she does admit to mild orthostatic dizziness which has been present for the past week.  She is a patient of Dr. Elberta Fortisamnitz, last seen 02-17-17 as a referral from her PCP for palpitations.  At this time, she was started on diltiazem 120 mg p.o., which she took immediately this morning after waking.  Her blood pressure this morning was 97/68 which is normal for her.  In taking her pulse she states that it is irregular and racing at times, and appears to be skipping beats.  It was advised that she come to the emergency department where an EKG can be performed to determine her rhythm.  She states that she has someone with her to bring her to the emergency department safely.  She has an appointment with Dr. Elberta Fortisamnitz this Wednesday, 06-16-17.  We appreciate her calling and letting us know of her symptoms.  Cardiology will follow as needed.  Georgie ChardJill Avriana Joo NP-C [facility] Pager: [phone]

## 2017-06-13 NOTE — ED Notes (Signed)
Patient transported to X-ray 

## 2017-06-13 NOTE — ED Provider Notes (Signed)
[facility] EMERGENCY DEPARTMENT Provider Note   CSN: [id number] Arrival date & time: 06/13/17  1003     History   Chief Complaint Chief Complaint  Patient presents with  . Palpitations    HPI Carla Griffin is a 37 y.o. female.  HPI   Sees Dr. Elberta Fortis  Reports occasional palpitations or irregular beats, but has not had anything that las lasted this long.  Woke up at about 530AM with palpitations, feeling of skipped beats and irregular beats as well as fast rhythm.  Felt some mild lightheadedness.  Has felt more dizzy when going from bending down to standing up.  Reports hx of POTs however had been doing well until this week.  Took cardizem this AM for symptoms and regular dose.   Reports hx of reflux as well, had pain in left side, woke up with that pain, has been present over the last week.  Felt more nauseas this week, increased belching.  More constipation. No vomiting, no fevers.  Some dyspnea on exertion which has been present for a while.   Left sided pain feels like a a pushing pain, sometimes sharp, nothing makes it better or worse, will sometimes ease up in the afternoon and evening.   No recent medication changes.  No recent surgeries, no recent travel. Is on OCPs.  No leg pain or swelling.  No hx early heart disease in family, no smoking or drugs   Past Medical History:  Diagnosis Date  . Gallstones   . GERD (gastroesophageal reflux disease)   . IBS (irritable bowel syndrome)   . Pneumonia   . POTS (postural orthostatic tachycardia syndrome)   . Urinary tract infection     Patient Active Problem List   Diagnosis Date Noted  . Postural orthostatic tachycardia syndrome 11/23/2016  . Gynecologic exam normal 06/23/2016  . Contraception management 06/23/2016  . Perimenopausal vasomotor symptoms 06/23/2016  . Heartburn   . Dyspepsia   . Left sided abdominal pain 01/08/2016  . Nausea without vomiting 01/08/2016  . Loose stools 01/08/2016  .  Generalized abdominal cramps 01/08/2016  . Early satiety 01/08/2016  . Swallowing difficulty 01/08/2016  . Elevated ALT measurement 01/08/2016  . Palpitations 08/14/2015  . Gastroesophageal reflux disease without esophagitis 08/14/2015  . Left-sided thoracic back pain 04/29/2015  . Fatigue 04/29/2015  . UNSPECIFIED VITAMIN D DEFICIENCY 05/06/2009  . BACK PAIN WITH RADICULOPATHY 02/15/2009    Past Surgical History:  Procedure Laterality Date  . BRAVO PH STUDY N/A 03/03/2016   Procedure: BRAVO PH STUDY;  Surgeon: Napoleon Form, MD;  Location: [facility];  Service: Endoscopy;  Laterality: N/A;  . CHOLECYSTECTOMY  2014  . ESOPHAGOGASTRODUODENOSCOPY (EGD) WITH PROPOFOL N/A 03/03/2016   Procedure: ESOPHAGOGASTRODUODENOSCOPY (EGD) WITH PROPOFOL;  Surgeon: Napoleon Form, MD;  Location: [facility];  Service: Endoscopy;  Laterality: N/A;  . WISDOM TOOTH EXTRACTION      OB History    Gravida Para Term Preterm AB Living   2 2 2     2    SAB TAB Ectopic Multiple Live Births                   Home Medications    Prior to Admission medications   Medication Sig Start Date End Date Taking? Authorizing Provider  diltiazem (CARDIZEM CD) 120 MG 24 hr capsule Take 1 capsule (120 mg total) by mouth daily. 02/17/17 06/13/17 Yes Leone Brand, NP  esomeprazole (NEXIUM) 40 MG capsule Take 1 capsule (  40 mg total) by mouth 2 (two) times daily before a meal. 02/19/17  Yes Nandigam, Eleonore ChiquitoKavitha V, MD  ibuprofen (ADVIL,MOTRIN) 200 MG tablet Take 200-400 mg by mouth every 6 (six) hours as needed for headache or mild pain.    Yes [provider]  ipratropium (ATROVENT) 0.06 % nasal spray Place 2 sprays 4 (four) times daily into both nostrils. Patient taking differently: Place 2 sprays into both nostrils 4 (four) times daily as needed for rhinitis.  04/12/17  Yes Sunnie NielsenAlexander, Natalie, DO  JUNEL FE 1/20 1-20 MG-MCG tablet TAKE 1 TABLET BY MOUTH DAILY. 03/31/17  Yes Hermina StaggersErvin, Michael L, MD  Multiple  Vitamin (MULTIVITAMIN) tablet Take 1 tablet by mouth daily.   Yes [provider]  Probiotic Product (ALIGN) 4 MG CAPS Take 4 mg by mouth daily before breakfast.    Yes [provider]  acetaminophen (TYLENOL) 500 MG tablet Take 500 mg by mouth daily as needed for mild pain or headache.     [provider]  artificial tears (LACRILUBE) OINT ophthalmic ointment Place into both eyes 3 (three) times daily. Patient not taking: Reported on 06/13/2017 04/26/17   Sunnie NielsenAlexander, Natalie, DO  carboxymethylcellulose 1 % ophthalmic solution Apply 1 drop to eye 3 (three) times daily. Patient not taking: Reported on 06/13/2017 04/16/17   Lurene ShadowPhelps, Holley Wirt O, PA-C  erythromycin ophthalmic ointment Apply 1 inch ribbon to affected eye TID for 5 days. Patient not taking: Reported on 06/13/2017 04/26/17   Sunnie NielsenAlexander, Natalie, DO    Family History Family History  Problem Relation Age of Onset  . Lupus Mother   . Hyperlipidemia Father   . Hypertension Father   . Diabetes Maternal Grandfather   . Heart attack Maternal Grandfather   . Stroke Paternal Grandfather     Social History Social History   Tobacco Use  . Smoking status: Never Smoker  . Smokeless tobacco: Never Used  Substance Use Topics  . Alcohol use: Yes    Alcohol/week: 0.0 oz    Comment: occasional  . Drug use: No     Allergies   Patient has no known allergies.   Review of Systems Review of Systems  Constitutional: Negative for fever.  HENT: Negative for sore throat.   Eyes: Negative for visual disturbance.  Respiratory: Negative for cough and shortness of breath (on exertion chronic unchanged).   Cardiovascular: Positive for chest pain and palpitations. Negative for leg swelling.  Gastrointestinal: Positive for nausea. Negative for abdominal pain, diarrhea and vomiting.  Genitourinary: Negative for difficulty urinating.  Musculoskeletal: Negative for back pain and neck pain.  Skin: Negative for rash.    Neurological: Positive for light-headedness. Negative for syncope and headaches.     Physical Exam Updated Vital Signs BP 119/87 (BP Location: Left Arm)   Pulse 72   Temp 98.2 F (36.8 C) (Oral)   Resp 18   LMP 06/06/2017   SpO2 100%   Physical Exam  Constitutional: She is oriented to person, place, and time. She appears well-developed and well-nourished. No distress.  HENT:  Head: Normocephalic and atraumatic.  Eyes: Conjunctivae and EOM are normal.  Neck: Normal range of motion.  Cardiovascular: Regular rhythm, normal heart sounds and intact distal pulses. Tachycardia present. Exam reveals no gallop and no friction rub.  No murmur heard. Pulmonary/Chest: Effort normal and breath sounds normal. No respiratory distress. She has no wheezes. She has no rales.  Abdominal: Soft. She exhibits no distension. There is no tenderness. There is no guarding.  Musculoskeletal:  She exhibits no edema or tenderness.  Neurological: She is alert and oriented to person, place, and time.  Skin: Skin is warm and dry. No rash noted. She is not diaphoretic. No erythema.  Nursing note and vitals reviewed.    ED Treatments / Results  Labs (all labs ordered are listed, but only abnormal results are displayed) Labs Reviewed  BASIC METABOLIC PANEL - Abnormal; Notable for the following components:      Result Value   Glucose, Bld 103 (*)    All other components within normal limits  CBC  D-DIMER, QUANTITATIVE (NOT AT [facility])  I-STAT TROPONIN, ED  I-STAT BETA HCG BLOOD, ED ([facility], [facility], [facility] ONLY)    EKG  EKG Interpretation  Date/Time:  Sunday June 13 2017 10:08:04 EST Ventricular Rate:  83 PR Interval:  118 QRS Duration: 80 QT Interval:  338 QTC Calculation: 397 R Axis:   84 Text Interpretation:  Sinus rhythm with marked sinus arrhythmia Nonspecific ST abnormality Abnormal ECG No significant change since last tracing Confirmed by Alvira Monday ([id number]) on 06/13/2017 1:02:42 PM        Radiology Dg Chest 2 View  Result Date: 06/13/2017 CLINICAL DATA:  37 year old female with history of palpitations. EXAM: CHEST  2 VIEW COMPARISON:  No priors. FINDINGS: Lung volumes are normal. No consolidative airspace disease. No pleural effusions. No pneumothorax. No pulmonary nodule or mass noted. Pulmonary vasculature and the cardiomediastinal silhouette are within normal limits. IMPRESSION: No radiographic evidence of acute cardiopulmonary disease. Electronically Signed   By: Trudie Reed M.D.   On: 06/13/2017 10:42    Procedures Procedures (including critical care time)  Medications Ordered in ED Medications  sodium chloride 0.9 % bolus 1,000 mL (1,000 mLs Intravenous New Bag/Given 06/13/17 1427)     Initial Impression / Assessment and Plan / ED Course  I have reviewed the triage vital signs and the nursing notes.  Pertinent labs & imaging results that were available during my care of the patient were reviewed by me and considered in my medical decision making (see chart for details).    37 year old female with a history of GERD, IBS, prior syncope, symptoms consistent with POTS, inappropriate sinus tachycardia, 13 beats of ventricular tachycardia on prior holter monitor, who presents with concern for palpitations.  Rhythm in the emergency department is sinus, sinus tachycardia.  EKG shows no significant findings.  Chest x-ray shows no signs of pneumonia.  Patient also reports a left-sided chest pain, intermittent left lower ribs.  Troponin is negative. Pain atypical for cardiac pain, low risk pt.  D-dimer is negative, she is low risk Wells and have low suspicion for PE.  I reviewed her telemetry which shows sinus tachycardia sometimes up to 120, however no other signs of VT, afib or SVT.  Patient given IV fluids with improvement in HR to 70s. Discussed with Cardiology, Dr. Rennis Golden patient's history of palpitations and prior holter monitoring results.  He recommends close follow  up as scheduled with Dr. Elberta Fortis this Wednesday, reports if we would like we can start short acting diltiazem however given short follow up, discussed with patient and will have her discuss this with her primary cardiologist. Feels improved in ED. Patient discharged in stable condition with understanding of reasons to return.   Final Clinical Impressions(s) / ED Diagnoses   Final diagnoses:  Heart palpitations  Sinus tachycardia    ED Discharge Orders    None       Alvira Monday, MD 06/13/17 1528

## 2017-06-13 NOTE — ED Notes (Signed)
Pt discharged from ED; instructions provided; Pt encouraged to return to ED if symptoms worsen and to f/u with PCP; Pt verbalized understanding of all instructions 

## 2017-06-13 NOTE — ED Triage Notes (Signed)
Pt reports having palpitations this am. Took cardizem pta. Has hx of tachycardia due to POTS. Has mild left side pain, denies sob.

## 2017-06-15 NOTE — Progress Notes (Signed)
Electrophysiology Office Note   Date:  06/16/2017   ID:  Carla Griffin, [place] [birth year], MRN [id number]  PCP:  [name], DO  Cardiologist:  Anne Fu Primary Electrophysiologist:  Jazziel Fitzsimmons [name], MD    Chief Complaint  Patient presents with  . Palpitations     History of Present Illness: Carla Griffin is a 37 y.o. female who is being seen today for the evaluation of PVCs at the request of [name], DO. Presenting today for electrophysiology evaluation. 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 She has a history of GERD, gallstones status post cholecystectomy, IBS, and PVCs. She had an episode of syncope. She saw her primary physician in July 2018 with an episode of syncope and a diagnosis of pots. She had orthostatic vital signs with elevated heart rates and reproduction of milder symptoms with postural changes. The beginning of 2018, and starting her job in of onset, she was standing up giving report when she felt hot, nauseated, weak and nearly passed out. Blood pressure was 70s over 40s. Symptoms recurred when she sat down. She has had more lightheadedness when standing.He has been increasing her salt and fluid intake.   She presented to the hospital on 06/13/17 with complaints of palpitations.  She was found to be in sinus tachycardia.  She was given IV fluids with improvement of her heart rate down into the 70s.  She felt improved and was discharged from the emergency room.  Today, denies symptoms of chest pain, shortness of breath, orthopnea, PND, lower extremity edema, claudication, presyncope, syncope, bleeding, or neurologic sequela. The patient is tolerating medications without difficulties.  She is continued to have symptoms of palpitations.  She has been drinking up to 2-1/2 L of fluid a day and increasing the salt in her diet.  Her palpitations happen throughout the day.  She was told that she had APCs in the emergency room.  She was feeling well up until approximately 1 week  ago when her palpitations started.  She does not see any connection to her menstrual cycle.   Past Medical History:  Diagnosis Date  . Gallstones   . GERD (gastroesophageal reflux disease)   . IBS (irritable bowel syndrome)   . Pneumonia   . POTS (postural orthostatic tachycardia syndrome)   . Urinary tract infection    Past Surgical History:  Procedure Laterality Date  . BRAVO PH STUDY N/A 03/03/2016   Procedure: BRAVO PH STUDY;  Surgeon: Napoleon Form, MD;  Location: [facility];  Service: Endoscopy;  Laterality: N/A;  . CHOLECYSTECTOMY  2014  . ESOPHAGOGASTRODUODENOSCOPY (EGD) WITH PROPOFOL N/A 03/03/2016   Procedure: ESOPHAGOGASTRODUODENOSCOPY (EGD) WITH PROPOFOL;  Surgeon: Napoleon Form, MD;  Location: [facility];  Service: Endoscopy;  Laterality: N/A;  . WISDOM TOOTH EXTRACTION       Current Outpatient Medications  Medication Sig Dispense Refill  . acetaminophen (TYLENOL) 500 MG tablet Take 500 mg by mouth daily as needed for mild pain or headache.     . esomeprazole (NEXIUM) 40 MG capsule Take 1 capsule (40 mg total) by mouth 2 (two) times daily before a meal. 180 capsule 3  . ibuprofen (ADVIL,MOTRIN) 200 MG tablet Take 200-400 mg by mouth every 6 (six) hours as needed for headache or mild pain.     Colleen Can FE 1/20 1-20 MG-MCG tablet TAKE 1 TABLET BY MOUTH DAILY. 84 tablet 1  . Multiple Vitamin (MULTIVITAMIN) tablet Take 1 tablet by mouth daily.    . Probiotic  Product (ALIGN) 4 MG CAPS Take 4 mg by mouth daily before breakfast.     . artificial tears (LACRILUBE) OINT ophthalmic ointment Place into both eyes 3 (three) times daily. (Patient not taking: Reported on 06/16/2017) 3.5 g 1  . carboxymethylcellulose 1 % ophthalmic solution Apply 1 drop to eye 3 (three) times daily. (Patient not taking: Reported on 06/16/2017) 30 mL 1  . diltiazem (CARDIZEM CD) 180 MG 24 hr capsule Take 1 capsule (180 mg total) by mouth daily. 30 capsule 6  . erythromycin ophthalmic ointment  Apply 1 inch ribbon to affected eye TID for 5 days. (Patient not taking: Reported on 06/16/2017) 3.5 g 0  . ipratropium (ATROVENT) 0.06 % nasal spray Place 2 sprays 4 (four) times daily into both nostrils. (Patient not taking: Reported on 06/16/2017) 15 mL 1   No current facility-administered medications for this visit.     Allergies:   Patient has no known allergies.   Social History:  The patient  reports that  has never smoked. she has never used smokeless tobacco. She reports that she drinks alcohol. She reports that she does not use drugs.   Family History:  The patient's family history includes Diabetes in her maternal grandfather; Heart attack in her maternal grandfather; Hyperlipidemia in her father; Hypertension in her father; Lupus in her mother; Stroke in her paternal grandfather.   ROS:  Please see the history of present illness.   Otherwise, review of systems is positive for rotations, shortness of breath, dizziness, headaches.   All other systems are reviewed and negative.   PHYSICAL EXAM: VS:  BP 128/82   Pulse 89   Ht 5\' 6"  (1.676 m)   Wt 148 lb 6.4 oz (67.3 kg)   LMP 06/06/2017   BMI 23.95 kg/m  , BMI Body mass index is 23.95 kg/m. GEN: Well nourished, well developed, in no acute distress  HEENT: normal  Neck: no JVD, carotid bruits, or masses Cardiac: RRR; no murmurs, rubs, or gallops,no edema  Respiratory:  clear to auscultation bilaterally, normal work of breathing GI: soft, nontender, nondistended, + BS MS: no deformity or atrophy  Skin: warm and dry Neuro:  Strength and sensation are intact Psych: euthymic mood, full affect  EKG:  EKG is not ordered today. Personal review of the ekg ordered 06/13/16 shows sinus rhythm, rate 83, APC, nonspecific T wave changes  Recent Labs: 11/23/2016: ALT 14; Magnesium 1.9; TSH 1.43 06/13/2017: BUN 12; Creatinine, Ser 0.81; Hemoglobin 13.0; Platelets 207; Potassium 3.8; Sodium 138    Lipid Panel     Component Value  Date/Time   CHOL 171 08/16/2009 0916   TRIG 46.0 08/16/2009 0916   HDL 68.70 08/16/2009 0916   CHOLHDL 2 08/16/2009 0916   VLDL 9.2 08/16/2009 0916   LDLCALC 93 08/16/2009 0916     Wt Readings from Last 3 Encounters:  06/16/17 148 lb 6.4 oz (67.3 kg)  04/16/17 141 lb (64 kg)  02/17/17 148 lb 3.2 oz (67.2 kg)      Other studies Reviewed: Additional studies/ records that were reviewed today include: TTE 12/30/16  Review of the above records today demonstrates:  - Left ventricle: The cavity size was normal. Systolic function was   normal. The estimated ejection fraction was in the range of 60%   to 65%. Wall motion was normal; there were no regional wall   motion abnormalities. Left ventricular diastolic function   parameters were normal. - Aortic valve: Trileaflet; normal thickness leaflets. There was no  regurgitation. - Aortic root: The aortic root was normal in size. - Mitral valve: Valve area by pressure half-time: 2.29 cm^2. - Left atrium: The atrium was normal in size. - Right ventricle: Systolic function was normal. - Right atrium: The atrium was normal in size. - Tricuspid valve: There was trivial regurgitation. - Pulmonary arteries: Systolic pressure was within the normal   range. - Inferior vena cava: The vessel was normal in size. The   respirophasic diameter changes were in the normal range (= 50%),   consistent with normal central venous pressure. - Pericardium, extracardiac: There was no pericardial effusion.  Monitor 02/03/17 - personally reviewed  On 01-24-17 at 4:14 PM patient experienced 13 beats of ventricular tachycardia - she experienced flutter sensation. Did not have syncope with this episode.  She did experience syncope at times during her event monitor, all of which correlated with sinus rhythm/sinus tachycardia  No atrial fibrillation, no pauses. Very rare PVC  ASSESSMENT AND PLAN:  1.  Autonomic dysfunction: Is likely partly the cause of her  current palpitations.  She has increased her fluid intake and salt intake in the past.  Is also on diltiazem.  Due to her continued symptoms, we Elliott Quade increase diltiazem to 180 mg.  2. Nonsustained VT: Found on monitoring.  Ejection fraction is normal with no other abnormalities on echo.  Continue further monitoring.  3.  APCs: Likely the cause of some of her symptoms of palpitations.  Increasing diltiazem today.   Current medicines are reviewed at length with the patient today.   The patient does not have concerns regarding her medicines.  The following changes were made today:  Increase diltiazem  Labs/ tests ordered today include:  No orders of the defined types were placed in this encounter.    Disposition:   FU with Cosby Proby 6 months  Signed, Tinia Oravec Jorja LoaMartin Chrishana Spargur, MD  06/16/2017 8:39 AM     [facility] [address] [place] [address] [phone] (office) [phone] (fax)

## 2017-06-16 ENCOUNTER — Encounter: Payer: Self-pay | Admitting: Cardiology

## 2017-06-16 ENCOUNTER — Ambulatory Visit: Payer: BLUE CROSS/BLUE SHIELD | Admitting: Cardiology

## 2017-06-16 VITALS — BP 128/82 | HR 89 | Ht 66.0 in | Wt 148.4 lb

## 2017-06-16 DIAGNOSIS — I491 Atrial premature depolarization: Secondary | ICD-10-CM

## 2017-06-16 DIAGNOSIS — R000 Tachycardia, unspecified: Secondary | ICD-10-CM | POA: Diagnosis not present

## 2017-06-16 MED ORDER — DILTIAZEM HCL ER COATED BEADS 180 MG PO CP24: 180.0000 mg | ORAL_CAPSULE | Freq: Every day | ORAL | 6 refills | Status: DC

## 2017-06-16 NOTE — Patient Instructions (Addendum)
Medication Instructions:  Your physician has recommended you make the following change in your medication:  1. INCREASE Diltiazem to 180 mg daily  * If you need a refill on your cardiac medications before your next appointment, please call your pharmacy. *  Labwork: None ordered  Testing/Procedures: None ordered  Follow-Up: Your physician wants you to follow-up in: 6 months with Dr. Elberta Fortisamnitz.  You will receive a reminder letter in the mail two months in advance. If you don't receive a letter, please call our office to schedule the follow-up appointment.  Thank you for choosing [facility]!!   Dory HornSherri Shaunn Tackitt, RN [phone]

## 2017-08-12 ENCOUNTER — Ambulatory Visit (INDEPENDENT_AMBULATORY_CARE_PROVIDER_SITE_OTHER): Payer: BLUE CROSS/BLUE SHIELD | Admitting: Osteopathic Medicine

## 2017-08-12 ENCOUNTER — Encounter: Payer: Self-pay | Admitting: Osteopathic Medicine

## 2017-08-12 VITALS — BP 127/77 | HR 78 | Temp 97.8°F | Wt 147.1 lb

## 2017-08-12 DIAGNOSIS — M9902 Segmental and somatic dysfunction of thoracic region: Secondary | ICD-10-CM

## 2017-08-12 DIAGNOSIS — M9901 Segmental and somatic dysfunction of cervical region: Secondary | ICD-10-CM | POA: Diagnosis not present

## 2017-08-12 DIAGNOSIS — M5489 Other dorsalgia: Secondary | ICD-10-CM | POA: Diagnosis not present

## 2017-08-12 DIAGNOSIS — M9904 Segmental and somatic dysfunction of sacral region: Secondary | ICD-10-CM

## 2017-08-12 MED ORDER — CYCLOBENZAPRINE HCL 5 MG PO TABS: 2.5000 mg | ORAL_TABLET | Freq: Three times a day (TID) | ORAL | 1 refills | Status: DC | PRN

## 2017-08-12 MED ORDER — IBUPROFEN 200 MG PO TABS: 200.0000 mg | ORAL_TABLET | Freq: Four times a day (QID) | ORAL | 0 refills | Status: DC | PRN

## 2017-08-12 NOTE — Patient Instructions (Signed)
Plan:  Ibuprofen 200-800 mg at home  Muscle relaxer cyclobenzaprine as needed  Rest a bit, avoid movements which exacerbate pain  I'm confident this is musculoskeletal rather than cardiac  ANy heart symptoms (chest pain, trouble breathing, dizziness, etc) to ER!  Physical therapy or sports medicine referral if not improving

## 2017-08-12 NOTE — Progress Notes (Signed)
HPI: Carla Griffin is a 37 y.o. female who  has a past medical history of Gallstones, GERD (gastroesophageal reflux disease), IBS (irritable bowel syndrome), Pneumonia, POTS (postural orthostatic tachycardia syndrome), and Urinary tract infection.  she presents to [facility] today, 08/12/17,  for chief complaint of: Back and shoulder pain Stress levels higher   Reports left-sided back pain around scapula/shoulder area. Worse with movement. Shoulder itself rotates fine, she is able to reach and left okay. Has been overusing this shoulder a bit more lately,water height issue at their house, she is having to go out to turn something on and off and away she's not used too. Her son has been upset as well lately over something and related, she has been holding him a lot in the side. She states her in-laws were worried that this might be something cardiac and asked her to come get checked out. No chest pain, palpitations, dizziness, lightheadedness, lower extremity edema    Past medical, surgical, social and family history reviewed:  Patient Active Problem List   Diagnosis Date Noted  . Postural orthostatic tachycardia syndrome 11/23/2016  . Gynecologic exam normal 06/23/2016  . Contraception management 06/23/2016  . Perimenopausal vasomotor symptoms 06/23/2016  . Heartburn   . Dyspepsia   . Left sided abdominal pain 01/08/2016  . Nausea without vomiting 01/08/2016  . Loose stools 01/08/2016  . Generalized abdominal cramps 01/08/2016  . Early satiety 01/08/2016  . Swallowing difficulty 01/08/2016  . Elevated ALT measurement 01/08/2016  . Palpitations 08/14/2015  . Gastroesophageal reflux disease without esophagitis 08/14/2015  . Left-sided thoracic back pain 04/29/2015  . Fatigue 04/29/2015  . UNSPECIFIED VITAMIN D DEFICIENCY 05/06/2009  . BACK PAIN WITH RADICULOPATHY 02/15/2009    Past Surgical History:  Procedure Laterality Date  . BRAVO PH STUDY  N/A 03/03/2016   Procedure: BRAVO PH STUDY;  Surgeon: Napoleon Form, MD;  Location: [facility];  Service: Endoscopy;  Laterality: N/A;  . CHOLECYSTECTOMY  2014  . ESOPHAGOGASTRODUODENOSCOPY (EGD) WITH PROPOFOL N/A 03/03/2016   Procedure: ESOPHAGOGASTRODUODENOSCOPY (EGD) WITH PROPOFOL;  Surgeon: Napoleon Form, MD;  Location: [facility];  Service: Endoscopy;  Laterality: N/A;  . WISDOM TOOTH EXTRACTION      Social History   Tobacco Use  . Smoking status: Never Smoker  . Smokeless tobacco: Never Used  Substance Use Topics  . Alcohol use: Yes    Alcohol/week: 0.0 oz    Comment: occasional    Family History  Problem Relation Age of Onset  . Lupus Mother   . Hyperlipidemia Father   . Hypertension Father   . Diabetes Maternal Grandfather   . Heart attack Maternal Grandfather   . Stroke Paternal Grandfather      Current medication list and allergy/intolerance information reviewed:    Current Outpatient Medications  Medication Sig Dispense Refill  . acetaminophen (TYLENOL) 500 MG tablet Take 500 mg by mouth daily as needed for mild pain or headache.     . diltiazem (CARDIZEM CD) 180 MG 24 hr capsule Take 1 capsule (180 mg total) by mouth daily. 30 capsule 6  . esomeprazole (NEXIUM) 40 MG capsule Take 1 capsule (40 mg total) by mouth 2 (two) times daily before a meal. 180 capsule 3  . ibuprofen (ADVIL,MOTRIN) 200 MG tablet Take 200-400 mg by mouth every 6 (six) hours as needed for headache or mild pain.     Colleen Can FE 1/20 1-20 MG-MCG tablet TAKE 1 TABLET BY MOUTH DAILY. 84 tablet  1  . Multiple Vitamin (MULTIVITAMIN) tablet Take 1 tablet by mouth daily.    . Probiotic Product (ALIGN) 4 MG CAPS Take 4 mg by mouth daily before breakfast.     . artificial tears (LACRILUBE) OINT ophthalmic ointment Place into both eyes 3 (three) times daily. (Patient not taking: Reported on 08/12/2017) 3.5 g 1  . carboxymethylcellulose 1 % ophthalmic solution Apply 1 drop to eye 3 (three)  times daily. (Patient not taking: Reported on 08/12/2017) 30 mL 1  . erythromycin ophthalmic ointment Apply 1 inch ribbon to affected eye TID for 5 days. (Patient not taking: Reported on 08/12/2017) 3.5 g 0  . ipratropium (ATROVENT) 0.06 % nasal spray Place 2 sprays 4 (four) times daily into both nostrils. (Patient not taking: Reported on 08/12/2017) 15 mL 1   No current facility-administered medications for this visit.     No Known Allergies    Review of Systems:  Constitutional:  No  fever, no chills, No recent illness,  HEENT: No  headache, no vision change,  Cardiac: No  chest pain, No  pressure, No palpitations, No  Orthopnea  Respiratory:  No  shortness of breath. No  Cough  Gastrointestinal: No  abdominal pain, No  nausea,   Musculoskeletal: +No new myalgia/arthralgia  Skin: No  Rash,  Neurologic: No  weakness, No  dizziness,  Psychiatric: No  concerns with depression, +concerns with anxiety, No sleep problems, No mood problems  Exam:  BP (!) 153/79 (BP Location: Left Arm)   Pulse 98   Temp 97.8 F (36.6 C) (Oral)   Wt 147 lb 1.3 oz (66.7 kg)   LMP 07/29/2017   BMI 23.74 kg/m   Constitutional: VS see above. General Appearance: alert, well-developed, well-nourished, NAD  Eyes: Normal lids and conjunctive, non-icteric sclera  Ears, Nose, Mouth, Throat: MMM, Normal external inspection ears/nares/mouth/lips/gums.   Neck: No masses, trachea midline. No thyroid enlargement. No tenderness/mass appreciated. No lymphadenopathy  Respiratory: Normal respiratory effort. no wheeze, no rhonchi, no rales  Cardiovascular: S1/S2 normal, no murmur, no rub/gallop auscultated. RRR. No lower extremity edema.   Musculoskeletal: Gait normal. No clubbing/cyanosis of digits.   Normal shoulder range of motion, no tenderness at before meals joint or humeral head. Negative drop arm test,Neer's, Hocking's, apprehension, liftoff. Tender point at paraspinal muscles thoracic region between  scapula and midthoracic. Grip strength equal. Some diminished range of motion in rotation of cervical spine  Neurological: Normal balance/coordination. No tremor.   Skin: warm, dry, intact. No rash/ulcer. No concerning nevi or subq nodules on limited exam.    Psychiatric: Normal judgment/insight. Normal mood and affect. Oriented x3.      ASSESSMENT/PLAN:   Left paraspinal back pain  Somatic dysfunction of spine, thoracic - (+)relief w/ MFR, FPR  Somatic dysfunction of scapulothoracic joint - (+)relief w/ MFR, FPR  Somatic dysfunction of spine, cervical - (+)relief w/ MFR    Patient Instructions  Plan:  Ibuprofen 200-800 mg at home  Muscle relaxer cyclobenzaprine as needed  Rest a bit, avoid movements which exacerbate pain  I'm confident this is musculoskeletal rather than cardiac  ANy heart symptoms (chest pain, trouble breathing, dizziness, etc) to ER!  Physical therapy or sports medicine referral if not improving    Visit summary with medication list and pertinent instructions was printed for patient to review. All questions at time of visit were answered - patient instructed to contact office with any additional concerns. ER/RTC precautions were reviewed with the patient.   Follow-up plan: Return if  symptoms worsen or fail to improve.  Note: Total time spent 25 minutes, greater than 50% of the visit was spent face-to-face counseling and coordinating care for the following: The primary encounter diagnosis was Left paraspinal back pain. Diagnoses of Somatic dysfunction of spine, thoracic, Somatic dysfunction of scapulothoracic joint, and Somatic dysfunction of spine, cervical were also pertinent to this visit.Marland Kitchen.  Please note: voice recognition software was used to produce this document, and typos may escape review. Please contact Dr. Lyn HollingsheadAlexander for any needed clarifications.

## 2017-09-15 ENCOUNTER — Ambulatory Visit (INDEPENDENT_AMBULATORY_CARE_PROVIDER_SITE_OTHER): Payer: BLUE CROSS/BLUE SHIELD | Admitting: Obstetrics & Gynecology

## 2017-09-15 ENCOUNTER — Encounter: Payer: Self-pay | Admitting: Obstetrics & Gynecology

## 2017-09-15 ENCOUNTER — Other Ambulatory Visit (HOSPITAL_COMMUNITY)
Admission: RE | Admit: 2017-09-15 | Payer: BLUE CROSS/BLUE SHIELD | Source: Ambulatory Visit | Admitting: Obstetrics & Gynecology

## 2017-09-15 VITALS — BP 130/82 | HR 89 | Ht 66.0 in | Wt 149.0 lb

## 2017-09-15 DIAGNOSIS — R39198 Other difficulties with micturition: Secondary | ICD-10-CM

## 2017-09-15 DIAGNOSIS — Z124 Encounter for screening for malignant neoplasm of cervix: Secondary | ICD-10-CM | POA: Diagnosis not present

## 2017-09-15 DIAGNOSIS — Z01419 Encounter for gynecological examination (general) (routine) without abnormal findings: Secondary | ICD-10-CM

## 2017-09-15 DIAGNOSIS — Z23 Encounter for immunization: Secondary | ICD-10-CM | POA: Diagnosis not present

## 2017-09-15 DIAGNOSIS — Z3041 Encounter for surveillance of contraceptive pills: Secondary | ICD-10-CM

## 2017-09-15 DIAGNOSIS — N951 Menopausal and female climacteric states: Secondary | ICD-10-CM

## 2017-09-15 DIAGNOSIS — Z113 Encounter for screening for infections with a predominantly sexual mode of transmission: Secondary | ICD-10-CM

## 2017-09-15 DIAGNOSIS — E559 Vitamin D deficiency, unspecified: Secondary | ICD-10-CM | POA: Diagnosis not present

## 2017-09-15 MED ORDER — NORETHIN ACE-ETH ESTRAD-FE 1-20 MG-MCG PO TABS: 1.0000 | ORAL_TABLET | Freq: Every day | ORAL | 5 refills | Status: DC

## 2017-09-15 NOTE — Progress Notes (Signed)
Subjective:    Carla Griffin is a 37 y.o.  Married P2 (7 and 102 yo sons) female who presents for an annual exam. The patient has no complaints today. She sometimes has the urge to pee but can only pee a little, feels like it is hard to empty her bladder, going on for a couple of years but worse lately. The patient is sexually active. GYN screening history: last pap: was normal. The patient wears seatbelts: yes. The patient participates in regular exercise: yes. (walking at least 10K steps per day)  Has the patient ever been transfused or tattooed?: no. The patient reports that there is not domestic violence in her life.   Menstrual History: OB History    Gravida  2   Para  2   Term  2   Preterm      AB      Living  2     SAB      TAB      Ectopic      Multiple      Live Births              Menarche age: 48 Patient's last menstrual period was 08/19/2017.    The following portions of the patient's history were reviewed and updated as appropriate: allergies, current medications, past family history, past medical history, past social history, past surgical history and problem list.  Review of Systems Pertinent items are noted in HPI.   FH- no breast/gyn/colon cancer Married for 10 years Using OCPs for contraception   Objective:    BP 130/82   Pulse 89   Ht 5\' 6"  (1.676 m)   Wt 149 lb (67.6 kg)   LMP 08/19/2017   BMI 24.05 kg/m   General Appearance:    Alert, cooperative, no distress, appears stated age  Head:    Normocephalic, without obvious abnormality, atraumatic  Eyes:    PERRL, conjunctiva/corneas clear, EOM's intact, fundi    benign, both eyes  Ears:    Normal TM's and external ear canals, both ears  Nose:   Nares normal, septum midline, mucosa normal, no drainage    or sinus tenderness  Throat:   Lips, mucosa, and tongue normal; teeth and gums normal  Neck:   Supple, symmetrical, trachea midline, no adenopathy;    thyroid:  no  enlargement/tenderness/nodules; no carotid   bruit or JVD  Back:     Symmetric, no curvature, ROM normal, no CVA tenderness  Lungs:     Clear to auscultation bilaterally, respirations unlabored  Chest Wall:    No tenderness or deformity   Heart:    Regular rate and rhythm, S1 and S2 normal, no murmur, rub   or gallop  Breast Exam:    No tenderness, masses, or nipple abnormality  Abdomen:     Soft, non-tender, bowel sounds active all four quadrants,    no masses, no organomegaly  Genitalia:    Normal female without lesion, discharge or tenderness, normal size and shape, anteverted, mobile, non-tender, normal adnexal exam      Extremities:   Extremities normal, atraumatic, no cyanosis or edema  Pulses:   2+ and symmetric all extremities  Skin:   Skin color, texture, turgor normal, no rashes or lesions  Lymph nodes:   Cervical, supraclavicular, and axillary nodes normal  Neurologic:   CNII-XII intact, normal strength, sensation and reflexes    throughout  .    Assessment:    Healthy female exam.  Plan:     Thin prep Pap smear. with cotesting TDAP Refill OCPs Referral to urology Vitamin D level

## 2017-09-16 ENCOUNTER — Other Ambulatory Visit: Payer: Self-pay | Admitting: Obstetrics & Gynecology

## 2017-09-16 LAB — CYTOLOGY - PAP
DIAGNOSIS: NEGATIVE
HPV (WINDOPATH): NOT DETECTED

## 2017-09-16 LAB — VITAMIN D 25 HYDROXY (VIT D DEFICIENCY, FRACTURES): Vit D, 25-Hydroxy: 23 ng/mL — ABNORMAL LOW (ref 30–100)

## 2017-09-16 MED ORDER — VITAMIN D (ERGOCALCIFEROL) 1.25 MG (50000 UNIT) PO CAPS: 50000.0000 [IU] | ORAL_CAPSULE | ORAL | 0 refills | Status: DC

## 2017-09-16 NOTE — Progress Notes (Signed)
Vitamin D called in for low vit D level.

## 2017-10-08 ENCOUNTER — Other Ambulatory Visit: Payer: Self-pay | Admitting: Obstetrics & Gynecology

## 2017-11-03 ENCOUNTER — Other Ambulatory Visit: Payer: Self-pay | Admitting: Obstetrics & Gynecology

## 2017-12-22 NOTE — Progress Notes (Signed)
Electrophysiology Office Note   Date:  12/23/2017   ID:  Carla Griffin, DOB 1980-07-15, MRN [id number]  PCP:  Sunnie NielsenAlexander, Natalie, DO  Cardiologist:  Anne FuSkains Primary Electrophysiologist:  Matthieu Loftus Jorja LoaMartin Kameran Lallier, MD    No chief complaint on file.    History of Present Illness: Carla Griffin is a 37 y.o. female who is being seen today for the evaluation of PVCs at the request of Sunnie NielsenAlexander, Natalie, DO. Presenting today for electrophysiology evaluation. She is a Engineer, civil (consulting)nurse at [employer]. She has a history of GERD, gallstones status post cholecystectomy, IBS, and PVCs. She had an episode of syncope. She saw her primary physician in July 2018 with an episode of syncope and a diagnosis of pots. She had orthostatic vital signs with elevated heart rates and reproduction of milder symptoms with postural changes. The beginning of 2018, and starting her job in of onset, she was standing up giving report when she felt hot, nauseated, weak and nearly passed out. Blood pressure was 70s over 40s. Symptoms recurred when she sat down. She has had more lightheadedness when standing.He has been increasing her salt and fluid intake.   She presented to the hospital on 06/13/17 with complaints of palpitations.  She was found to be in sinus tachycardia.  She was given IV fluids with improvement of her heart rate down into the 70s.  She felt improved and was discharged from the emergency room.  Today, denies symptoms of palpitations, chest pain, shortness of breath, orthopnea, PND, lower extremity edema, claudication, dizziness, presyncope, syncope, bleeding, or neurologic sequela. The patient is tolerating medications without difficulties.  Overall she is feeling well.  She continues to have some palpitations and shortness of breath for example when she walks up and down stairs.  She was able to taste after her son on his bicycle and she did well with that.  She notes that her blood pressure drops when she gets sick.  Otherwise she  has been doing well.   Past Medical History:  Diagnosis Date  . Gallstones   . GERD (gastroesophageal reflux disease)   . IBS (irritable bowel syndrome)   . Pneumonia   . POTS (postural orthostatic tachycardia syndrome)   . Urinary tract infection    Past Surgical History:  Procedure Laterality Date  . BRAVO PH STUDY N/A 03/03/2016   Procedure: BRAVO PH STUDY;  Surgeon: Napoleon FormKavitha V Nandigam, MD;  Location: [facility];  Service: Endoscopy;  Laterality: N/A;  . CHOLECYSTECTOMY  2014  . ESOPHAGOGASTRODUODENOSCOPY (EGD) WITH PROPOFOL N/A 03/03/2016   Procedure: ESOPHAGOGASTRODUODENOSCOPY (EGD) WITH PROPOFOL;  Surgeon: Napoleon FormKavitha V Nandigam, MD;  Location: [facility];  Service: Endoscopy;  Laterality: N/A;  . WISDOM TOOTH EXTRACTION       Current Outpatient Medications  Medication Sig Dispense Refill  . acetaminophen (TYLENOL) 500 MG tablet Take 500 mg by mouth daily as needed for mild pain or headache.     . diltiazem (CARDIZEM CD) 180 MG 24 hr capsule Take 1 capsule (180 mg total) by mouth daily. 30 capsule 6  . esomeprazole (NEXIUM) 40 MG capsule Take 1 capsule (40 mg total) by mouth 2 (two) times daily before a meal. 180 capsule 3  . ibuprofen (ADVIL,MOTRIN) 200 MG tablet Take 1-4 tablets (200-800 mg total) by mouth every 6 (six) hours as needed for headache or mild pain. 30 tablet 0  . Multiple Vitamin (MULTIVITAMIN) tablet Take 1 tablet by mouth daily.    . norethindrone-ethinyl estradiol (JUNEL FE 1/20) 1-20 MG-MCG  tablet Take 1 tablet by mouth daily. 84 tablet 5  . Probiotic Product (ALIGN) 4 MG CAPS Take 4 mg by mouth daily before breakfast.     . Vitamin D, Ergocalciferol, (DRISDOL) 50000 units CAPS capsule TAKE 1 CAPSULE (50,000 UNITS TOTAL) BY MOUTH EVERY 7 (SEVEN) DAYS. 8 capsule 0   No current facility-administered medications for this visit.     Allergies:   Patient has no known allergies.   Social History:  The patient  reports that she has never smoked. She has never  used smokeless tobacco. She reports that she drinks alcohol. She reports that she does not use drugs.   Family History:  The patient's family history includes Diabetes in her maternal grandfather; Heart attack in her maternal grandfather; Hyperlipidemia in her father; Hypertension in her father; Lupus in her mother; Stroke in her paternal grandfather.   ROS:  Please see the history of present illness.   Otherwise, review of systems is positive for palpitations, shortness of breath, dizziness, headaches.   All other systems are reviewed and negative.   PHYSICAL EXAM: VS:  BP 124/76   Pulse 88   Ht 5\' 6"  (1.676 m)   Wt 150 lb 6.4 oz (68.2 kg)   SpO2 98%   BMI 24.28 kg/m  , BMI Body mass index is 24.28 kg/m. GEN: Well nourished, well developed, in no acute distress  HEENT: normal  Neck: no JVD, carotid bruits, or masses Cardiac: RRR; no murmurs, rubs, or gallops,no edema  Respiratory:  clear to auscultation bilaterally, normal work of breathing GI: soft, nontender, nondistended, + BS MS: no deformity or atrophy  Skin: warm and dry Neuro:  Strength and sensation are intact Psych: euthymic mood, full affect  EKG:  EKG is ordered today. Personal review of the ekg ordered shows sinus rhythm, rate 88  Recent Labs: 06/13/2017: BUN 12; Creatinine, Ser 0.81; Hemoglobin 13.0; Platelets 207; Potassium 3.8; Sodium 138    Lipid Panel     Component Value Date/Time   CHOL 171 08/16/2009 0916   TRIG 46.0 08/16/2009 0916   HDL 68.70 08/16/2009 0916   CHOLHDL 2 08/16/2009 0916   VLDL 9.2 08/16/2009 0916   LDLCALC 93 08/16/2009 0916     Wt Readings from Last 3 Encounters:  12/23/17 150 lb 6.4 oz (68.2 kg)  09/15/17 149 lb (67.6 kg)  08/12/17 147 lb 1.3 oz (66.7 kg)      Other studies Reviewed: Additional studies/ records that were reviewed today include: TTE 12/30/16  Review of the above records today demonstrates:  - Left ventricle: The cavity size was normal. Systolic function was    normal. The estimated ejection fraction was in the range of 60%   to 65%. Wall motion was normal; there were no regional wall   motion abnormalities. Left ventricular diastolic function   parameters were normal. - Aortic valve: Trileaflet; normal thickness leaflets. There was no   regurgitation. - Aortic root: The aortic root was normal in size. - Mitral valve: Valve area by pressure half-time: 2.29 cm^2. - Left atrium: The atrium was normal in size. - Right ventricle: Systolic function was normal. - Right atrium: The atrium was normal in size. - Tricuspid valve: There was trivial regurgitation. - Pulmonary arteries: Systolic pressure was within the normal   range. - Inferior vena cava: The vessel was normal in size. The   respirophasic diameter changes were in the normal range (= 50%),   consistent with normal central venous pressure. - Pericardium, extracardiac:  There was no pericardial effusion.  Monitor 02/03/17 - personally reviewed  On 01-24-17 at 4:14 PM patient experienced 13 beats of ventricular tachycardia - she experienced flutter sensation. Did not have syncope with this episode.  She did experience syncope at times during her event monitor, all of which correlated with sinus rhythm/sinus tachycardia  No atrial fibrillation, no pauses. Very rare PVC  ASSESSMENT AND PLAN:  1.  Autonomic dysfunction: Likely the cause of most of her palpitations.  She has been drinking quite a bit of fluid and is on diltiazem.  She is  continued to have minor symptoms.  Avianah Pellman increase diltiazem to 240 mg.  2. Nonsustained VT: Found on monitoring.  Ejection fraction was normal with no other abnormalities on echo.  Continue with further monitoring.  3.  APCs: Increasing diltiazem today.  Hopefully this Peyson Delao control her symptoms.  I have told her that if her blood pressure drops when she is sick that she can hold her diltiazem for short period.   Current medicines are reviewed at length with the  patient today.   The patient does not have concerns regarding her medicines.  The following changes were made today: Increase diltiazem  Labs/ tests ordered today include:  Orders Placed This Encounter  Procedures  . EKG 12-Lead     Disposition:   FU with Betzaira Mentel 6 months  Signed, Evren Shankland [name], MD  12/23/2017 11:18 AM     [facility] [address] [place] [address] [phone] (office) [phone] (fax)

## 2017-12-23 ENCOUNTER — Other Ambulatory Visit: Payer: Self-pay | Admitting: Obstetrics & Gynecology

## 2017-12-23 ENCOUNTER — Encounter: Payer: Self-pay | Admitting: Cardiology

## 2017-12-23 ENCOUNTER — Ambulatory Visit: Payer: BLUE CROSS/BLUE SHIELD | Admitting: Cardiology

## 2017-12-23 VITALS — BP 124/76 | HR 88 | Ht 66.0 in | Wt 150.4 lb

## 2017-12-23 DIAGNOSIS — I471 Supraventricular tachycardia: Secondary | ICD-10-CM

## 2017-12-23 DIAGNOSIS — R000 Tachycardia, unspecified: Secondary | ICD-10-CM

## 2017-12-23 MED ORDER — DILTIAZEM HCL ER COATED BEADS 240 MG PO CP24: 240.0000 mg | ORAL_CAPSULE | Freq: Every day | ORAL | 3 refills | Status: DC

## 2017-12-23 MED ORDER — DILTIAZEM HCL ER COATED BEADS 240 MG PO CP24: 240.0000 mg | ORAL_CAPSULE | Freq: Every day | ORAL | 6 refills | Status: DC

## 2017-12-23 NOTE — Patient Instructions (Addendum)
Medication Instructions:  Your physician has recommended you make the following change in your medication:  1. INCREASE Diltiazem to 240 mg daily  * If you need a refill on your cardiac medications before your next appointment, please call your pharmacy.   Labwork: None ordered.  Testing/Procedures: None ordered  Follow-Up: Your physician wants you to follow-up in: 6 months with Dr. Elberta Fortisamnitz.  You will receive a reminder letter in the mail two months in advance. If you don't receive a letter, please call our office to schedule the follow-up appointment.  *Please note that any paperwork needing to be filled out by the provider will need to be addressed at the front desk prior to seeing the provider. Please note that any FMLA, disability or other documents regarding health condition is subject to a $25.00 charge that must be received prior to completion of paperwork in the form of a money order or check.  Thank you for choosing [facility]!!   Dory HornSherri Fortunato Nordin, RN [phone]

## 2018-01-26 ENCOUNTER — Encounter: Payer: Self-pay | Admitting: Osteopathic Medicine

## 2018-01-31 ENCOUNTER — Ambulatory Visit: Payer: BLUE CROSS/BLUE SHIELD | Admitting: Physician Assistant

## 2018-01-31 ENCOUNTER — Encounter: Payer: Self-pay | Admitting: Physician Assistant

## 2018-01-31 VITALS — BP 130/80 | HR 92 | Ht 65.5 in | Wt 147.0 lb

## 2018-01-31 DIAGNOSIS — R1013 Epigastric pain: Secondary | ICD-10-CM

## 2018-01-31 DIAGNOSIS — K580 Irritable bowel syndrome with diarrhea: Secondary | ICD-10-CM

## 2018-01-31 DIAGNOSIS — R110 Nausea: Secondary | ICD-10-CM

## 2018-01-31 DIAGNOSIS — K219 Gastro-esophageal reflux disease without esophagitis: Secondary | ICD-10-CM

## 2018-01-31 MED ORDER — DEXLANSOPRAZOLE 60 MG PO CPDR: 60.0000 mg | DELAYED_RELEASE_CAPSULE | Freq: Every day | ORAL | 3 refills | Status: DC

## 2018-01-31 MED ORDER — HYOSCYAMINE SULFATE 0.125 MG SL SUBL: 0.1250 mg | SUBLINGUAL_TABLET | Freq: Two times a day (BID) | SUBLINGUAL | 2 refills | Status: DC

## 2018-01-31 NOTE — Progress Notes (Signed)
Chief Complaint: Epigastric Abdominal pain, Diarrhea, Reflux  HPI:    Carla Griffin is a 37 y/o Caucasian female, known to Dr. Lavon Paganini, with a history of IBS, GERD who presents to clinic today with a complaint of abdominal pain, diarrhea and reflux.    Office visit 02/24/2016 with Dr. Lavon Paganini.  At that time patient had complaints of dyspepsia, indigestion and intermittent diarrhea and left upper quadrant pain, CT chest showed concern for fatty liver.  She was scheduled for an EGD and Bravo.  EGD showed reflux related changes with no evidence of eosinophilic esophagitis.  Probable revealed evidence of increased gastroesophageal acid reflux with positive symptom correlation.  After these results was placed on Nexium twice daily.    Today, patient explains that over the past 3 to 4 weeks she has woke up and had diarrhea in the morning and abdominal cramps throughout the day, initially these were worse in her lower abdomen and would radiate up to her left upper quadrant, over the past week or so the cramping has got slightly better.  Initially patient tells me she was taking Imodium in the morning as she works as a Engineer, civil (consulting), but this led to constipation, so much so that the patient had to use mag citrate and now has continued for the past week with diarrhea.  Associated symptoms include fatigue and a general feeling of "not being well".    Also describes epigastric pain and some nausea with breakthrough reflux regardless of her Nexium 40 mg twice daily.  Tells me that she has been on various PPIs/acid medicines in the past, Prilosec, Protonix and Pepcid which have all stopped working over a period of time.    Also describes a feeling of a "shift" in her lower abdomen when she is straining to pee over the past 4 months.  Describes some breakthrough vaginal bleeding even though she is on oral contraception.  Apparently this is abnormal for her and she has been on this form of contraception for 11 years now.  She is  going to see her gynecologist in 2 days.    Denies fever, chills, weight loss or symptoms that awaken her from sleep.   Past Medical History:  Diagnosis Date  . Gallstones   . GERD (gastroesophageal reflux disease)   . IBS (irritable bowel syndrome)   . Pneumonia   . POTS (postural orthostatic tachycardia syndrome)   . Urinary tract infection     Past Surgical History:  Procedure Laterality Date  . BRAVO PH STUDY N/A 03/03/2016   Procedure: BRAVO PH STUDY;  Surgeon: Napoleon Form, MD;  Location: [facility];  Service: Endoscopy;  Laterality: N/A;  . CHOLECYSTECTOMY  2014  . ESOPHAGOGASTRODUODENOSCOPY (EGD) WITH PROPOFOL N/A 03/03/2016   Procedure: ESOPHAGOGASTRODUODENOSCOPY (EGD) WITH PROPOFOL;  Surgeon: Napoleon Form, MD;  Location: [facility];  Service: Endoscopy;  Laterality: N/A;  . WISDOM TOOTH EXTRACTION      Current Outpatient Medications  Medication Sig Dispense Refill  . acetaminophen (TYLENOL) 500 MG tablet Take 500 mg by mouth daily as needed for mild pain or headache.     . diltiazem (CARDIZEM CD) 240 MG 24 hr capsule Take 1 capsule (240 mg total) by mouth daily. 30 capsule 6  . esomeprazole (NEXIUM) 40 MG capsule Take 1 capsule (40 mg total) by mouth 2 (two) times daily before a meal. 180 capsule 3  . ibuprofen (ADVIL,MOTRIN) 200 MG tablet Take 1-4 tablets (200-800 mg total) by mouth every 6 (six) hours as  needed for headache or mild pain. 30 tablet 0  . Multiple Vitamin (MULTIVITAMIN) tablet Take 1 tablet by mouth daily.    . norethindrone-ethinyl estradiol (JUNEL FE 1/20) 1-20 MG-MCG tablet Take 1 tablet by mouth daily. 84 tablet 5  . Probiotic Product (ALIGN) 4 MG CAPS Take 4 mg by mouth daily before breakfast.     . Vitamin D, Ergocalciferol, (DRISDOL) 50000 units CAPS capsule TAKE 1 CAPSULE (50,000 UNITS TOTAL) BY MOUTH EVERY 7 (SEVEN) DAYS. 8 capsule 0  . dexlansoprazole (DEXILANT) 60 MG capsule Take 1 capsule (60 mg total) by mouth daily. 30 capsule  3  . hyoscyamine (LEVSIN SL) 0.125 MG SL tablet Place 1 tablet (0.125 mg total) under the tongue 2 (two) times daily. 60 tablet 2   No current facility-administered medications for this visit.     Allergies as of 01/31/2018  . (No Known Allergies)    Family History  Problem Relation Age of Onset  . Lupus Mother   . Hyperlipidemia Father   . Hypertension Father   . Diabetes Maternal Grandfather   . Heart attack Maternal Grandfather   . Stroke Paternal Grandfather     Social History   Socioeconomic History  . Marital status: Married    Spouse name: Not on file  . Number of children: 2  . Years of education: Not on file  . Highest education level: Not on file  Occupational History  . Occupation: Engineer, civil (consulting) at [employer]    Comment: Ortho Floor  Social Needs  . Financial resource strain: Not on file  . Food insecurity:    Worry: Not on file    Inability: Not on file  . Transportation needs:    Medical: Not on file    Non-medical: Not on file  Tobacco Use  . Smoking status: Never Smoker  . Smokeless tobacco: Never Used  Substance and Sexual Activity  . Alcohol use: Yes    Alcohol/week: 0.0 standard drinks    Comment: occasional  . Drug use: No  . Sexual activity: Yes    Birth control/protection: None  Lifestyle  . Physical activity:    Days per week: Not on file    Minutes per session: Not on file  . Stress: Not on file  Relationships  . Social connections:    Talks on phone: Not on file    Gets together: Not on file    Attends religious service: Not on file    Active member of club or organization: Not on file    Attends meetings of clubs or organizations: Not on file    Relationship status: Not on file  . Intimate partner violence:    Fear of current or ex partner: Not on file    Emotionally abused: Not on file    Physically abused: Not on file    Forced sexual activity: Not on file  Other Topics Concern  . Not on file  Social History Narrative   Lives with  husband in [place].   Active in her church.   Regular exercise:  Yes   RN at [employer]    Review of Systems:    Constitutional: No weight loss, fever or chills Cardiovascular: No chest pain Respiratory: No SOB Gastrointestinal: See HPI and otherwise negative   Physical Exam:  Vital signs: BP 130/80 (BP Location: Left Arm, Patient Position: Sitting, Cuff Size: Normal)   Pulse 92   Ht 5' 5.5" (1.664 m) Comment: height measured without shoes  Wt 147 lb (66.7  kg)   LMP 01/28/2018   BMI 24.09 kg/m   Constitutional:   Pleasant Caucasian female appears to be in NAD, Well developed, Well nourished, alert and cooperative Respiratory: Respirations even and unlabored. Lungs clear to auscultation bilaterally.   No wheezes, crackles, or rhonchi.  Cardiovascular: Normal S1, S2. No MRG. Regular rate and rhythm. No peripheral edema, cyanosis or pallor.  Gastrointestinal:  Soft, nondistended,mild epigastric and suprapubic ttp, No rebound or guarding. Normal bowel sounds. No appreciable masses or hepatomegaly. Psychiatric:  Demonstrates good judgement and reason without abnormal affect or behaviors.  RELEVANT LABS AND IMAGING: CBC    Component Value Date/Time   WBC 4.5 06/13/2017 1018   RBC 4.24 06/13/2017 1018   HGB 13.0 06/13/2017 1018   HGB 14.7 03/17/2013 1041   HCT 39.0 06/13/2017 1018   HCT 43.1 03/17/2013 1041   PLT 207 06/13/2017 1018   PLT 201 03/17/2013 1041   MCV 92.0 06/13/2017 1018   MCV 90 03/17/2013 1041   MCH 30.7 06/13/2017 1018   MCHC 33.3 06/13/2017 1018   RDW 13.1 06/13/2017 1018   RDW 12.8 03/17/2013 1041   LYMPHSABS 1,540 01/07/2016 1138   LYMPHSABS 1.6 11/01/2012 1107   MONOABS 330 01/07/2016 1138   MONOABS 0.6 11/01/2012 1107   EOSABS 55 01/07/2016 1138   EOSABS 0.0 11/01/2012 1107   BASOSABS 55 01/07/2016 1138   BASOSABS 0.0 11/01/2012 1107    CMP     Component Value Date/Time   NA 138 06/13/2017 1018   NA 136 03/17/2013 1041   K 3.8 06/13/2017 1018    K 3.4 (L) 03/17/2013 1041   CL 106 06/13/2017 1018   CL 104 03/17/2013 1041   CO2 22 06/13/2017 1018   CO2 24 03/17/2013 1041   GLUCOSE 103 (H) 06/13/2017 1018   GLUCOSE 83 03/17/2013 1041   BUN 12 06/13/2017 1018   BUN 11 03/17/2013 1041   CREATININE 0.81 06/13/2017 1018   CREATININE 0.86 11/23/2016 1215   CALCIUM 9.2 06/13/2017 1018   CALCIUM 9.4 03/17/2013 1041   PROT 6.8 11/23/2016 1215   PROT 8.0 03/17/2013 1041   ALBUMIN 4.2 11/23/2016 1215   ALBUMIN 3.9 03/17/2013 1041   AST 14 11/23/2016 1215   AST 55 (H) 03/17/2013 1041   ALT 14 11/23/2016 1215   ALT 51 03/17/2013 1041   ALKPHOS 57 11/23/2016 1215   ALKPHOS 95 03/17/2013 1041   BILITOT 0.4 11/23/2016 1215   BILITOT 0.9 03/17/2013 1041   GFRNONAA >60 06/13/2017 1018   GFRNONAA 87 11/23/2016 1215   GFRAA >60 06/13/2017 1018   GFRAA >89 11/23/2016 1215    Assessment: 1.  Epigastric abdominal pain: With some nausea over the past 3 to 4 weeks, also breakthrough reflux symptoms regardless of Nexium 40 mg twice daily, positive Bravo testing and EGD in 2017; consider Nexium tachyphylaxis  plus/minus functional dyspepsia versus other 2.  IBS: With increased abdominal cramping and loose stools 3.  GERD: Breakthrough symptoms  Plan: 1.  Would recommend patient continue daily Align.  Did provide her with a coupon. 2.  Recommend increasing fiber to at least 25-35 g/day through her diet and/or a supplement such as Metamucil, Citrucel or Benefiber. 3.  Prescribed Hyoscyamine sulfate 0.125 mg twice daily, every morning and 30 minutes before dinner.  #60 with 3 refills 4.  Prescribed Dexilant 60 mg daily.  This will replace patient's Nexium 40 mg twice daily.  Patient has failed therapy now with Nexium, Prilosec and Pantoprazole.  If this  is too expensive for the patient, may be able to try AcipHex. 5.  Provided patient with information regarding the low FODMAP diet.  Discussed this in detail and answered questions. 6.  Explained to  patient that she should let us know if her gynecologist finds anything on Wednesday 7.  Patient to follow in clinic with me or Dr. Lavon Paganini in 4-6 weeks or sooner if necessary.  Hyacinth Meeker, PA-C [facility] 01/31/2018, 11:58 AM  Cc: [name], DO

## 2018-01-31 NOTE — Patient Instructions (Signed)
We have sent the following medications to your pharmacy for you to pick up at your convenience:  Hyoscyamine 0.125 mg twice a day in the morning and before dinner.   dexilant 60 mg daily   Stop nexium .   We have given you a low FODMAP diet handout.   We have given you a high fiber diet handout. Please strive to have 25-30 grams of fiber daily.   Try a daily probiotic such as Align.

## 2018-02-02 ENCOUNTER — Encounter: Payer: Self-pay | Admitting: Obstetrics & Gynecology

## 2018-02-02 ENCOUNTER — Ambulatory Visit (INDEPENDENT_AMBULATORY_CARE_PROVIDER_SITE_OTHER): Payer: BLUE CROSS/BLUE SHIELD | Admitting: Obstetrics & Gynecology

## 2018-02-02 VITALS — BP 125/70 | HR 91 | Resp 16 | Ht 65.5 in | Wt 150.0 lb

## 2018-02-02 DIAGNOSIS — E559 Vitamin D deficiency, unspecified: Secondary | ICD-10-CM | POA: Diagnosis not present

## 2018-02-02 DIAGNOSIS — N921 Excessive and frequent menstruation with irregular cycle: Secondary | ICD-10-CM | POA: Diagnosis not present

## 2018-02-02 NOTE — Progress Notes (Signed)
   Subjective:    Patient ID: Carla Griffin, female    DOB: August 10, 1980, 37 y.o.   MRN: [id number]  HPI 37 yo married P2 here with the issue of BTB for 4 days. She is still taking her active OCPs. She normally has a 2-6 days period, has been on OCPs for about 11 years. She does not think that there is a chance that she could have CT or GC.   Review of Systems On Vit D for deficiency Fh- no cancer    Objective:   Physical Exam Breathing, conversing, and ambulating normally Well nourished, well hydrated White female, no apparent distress Cervix- ectropion noted, somewhat friable     Assessment & Plan:  Will get flu vaccine at work- [facility]  BTB Check TSH, reassurance given Will order gyn u/s if she wants  Unwanted fertility- discussed vasectomy versus lap BS  I discussed that OCPs can lead to an extropion and cervical friability.

## 2018-02-03 LAB — TSH: TSH: 2.28 m[IU]/L

## 2018-02-03 LAB — VITAMIN D 25 HYDROXY (VIT D DEFICIENCY, FRACTURES): VIT D 25 HYDROXY: 42 ng/mL (ref 30–100)

## 2018-02-07 ENCOUNTER — Ambulatory Visit (INDEPENDENT_AMBULATORY_CARE_PROVIDER_SITE_OTHER): Payer: BLUE CROSS/BLUE SHIELD | Admitting: Osteopathic Medicine

## 2018-02-07 ENCOUNTER — Encounter: Payer: Self-pay | Admitting: Osteopathic Medicine

## 2018-02-07 VITALS — BP 129/74 | HR 87 | Temp 98.2°F | Wt 149.9 lb

## 2018-02-07 DIAGNOSIS — K582 Mixed irritable bowel syndrome: Secondary | ICD-10-CM | POA: Diagnosis not present

## 2018-02-07 DIAGNOSIS — R109 Unspecified abdominal pain: Secondary | ICD-10-CM

## 2018-02-07 LAB — POCT URINALYSIS DIPSTICK
Bilirubin, UA: NEGATIVE
Blood, UA: NEGATIVE
Glucose, UA: NEGATIVE
KETONES UA: NEGATIVE
LEUKOCYTES UA: NEGATIVE
NITRITE UA: NEGATIVE
PROTEIN UA: NEGATIVE
Spec Grav, UA: 1.01 (ref 1.010–1.025)
Urobilinogen, UA: 0.2 E.U./dL
pH, UA: 7 (ref 5.0–8.0)

## 2018-02-07 MED ORDER — RABEPRAZOLE SODIUM 20 MG PO TBEC: 20.0000 mg | DELAYED_RELEASE_TABLET | Freq: Every day | ORAL | 1 refills | Status: DC

## 2018-02-07 MED ORDER — HYOSCYAMINE SULFATE 0.125 MG SL SUBL: 0.1250 mg | SUBLINGUAL_TABLET | Freq: Two times a day (BID) | SUBLINGUAL | 2 refills | Status: DC

## 2018-02-07 NOTE — Progress Notes (Signed)
Reviewed and agree with documentation and assessment and plan. K. Veena Kile Kabler , MD   

## 2018-02-07 NOTE — Progress Notes (Signed)
HPI: Carla Griffin is a 37 y.o. female who  has a past medical history of Gallstones, GERD (gastroesophageal reflux disease), IBS (irritable bowel syndrome), Pneumonia, POTS (postural orthostatic tachycardia syndrome), and Urinary tract infection.  she presents to [facility] today, 02/07/18,  for chief complaint of:  Abdominal pain  . Context: Diarrhea alternating with constipation ongoing for her for some time but seems to have flared badly recently . Location/Quality: Generalized migrating abdominal pain/cramping . Duration: 3-4 weeks . Assoc signs/symptoms: trouble emptying her bladder. Diarrhea.   . Also seen by GI 01/31/18 - dx IBS flare (abd pain, diarrhea alternating w/ constipaion, reflux, nausea) recommended Align, fiber increase, hyoscyamine, dexilant instead of nexium, low FODMAP - f/u 4-6 wk  . Also seen by GYN 02/02/18 - bleeding, no major concerns offered [place]      Past medical history, surgical history, and family history reviewed.  Current medication list and allergy/intolerance information reviewed.   (See remainder of HPI, ROS, Phys Exam below)  No results found.  Results for orders placed or performed in visit on 02/07/18 (from the past 72 hour(s))  POCT Urinalysis Dipstick     Status: None   Collection Time: 02/07/18 10:50 AM  Result Value Ref Range   Color, UA yellow    Clarity, UA clear    Glucose, UA Negative Negative   Bilirubin, UA Negative    Ketones, UA Negative    Spec Grav, UA 1.010 1.010 - 1.025   Blood, UA Negative    pH, UA 7.0 5.0 - 8.0   Protein, UA Negative Negative   Urobilinogen, UA 0.2 0.2 or 1.0 E.U./dL   Nitrite, UA Negative    Leukocytes, UA Negative Negative   Appearance     Odor       ASSESSMENT/PLAN: The primary encounter diagnosis was Irritable bowel syndrome with both constipation and diarrhea. A diagnosis of Bilateral flank pain was also pertinent to this visit.   Reviewed recent GI notes,  will follow their instructions regarding medication management, patient was advised to follow low FODMAP diet and since her symptoms are starting to resolve further work-up by GI may not be necessary but she should schedule a follow-up with them if symptoms start to worsen again/persist.   Meds ordered this encounter  Medications  . RABEprazole (ACIPHEX) 20 MG tablet    Sig: Take 1 tablet (20 mg total) by mouth daily.    Dispense:  30 tablet    Refill:  1  . hyoscyamine (LEVSIN SL) 0.125 MG SL tablet    Sig: Place 1 tablet (0.125 mg total) under the tongue 2 (two) times daily.    Dispense:  60 tablet    Refill:  2     Follow-up plan: No follow-ups on file.                             ############################################ ############################################ ############################################ ############################################    Outpatient Encounter Medications as of 02/07/2018  Medication Sig Note  . acetaminophen (TYLENOL) 500 MG tablet Take 500 mg by mouth daily as needed for mild pain or headache.    . diltiazem (CARDIZEM CD) 240 MG 24 hr capsule Take 1 capsule (240 mg total) by mouth daily.   Marland Kitchen esomeprazole (NEXIUM) 40 MG capsule Take 1 capsule (40 mg total) by mouth 2 (two) times daily before a meal.   . ibuprofen (ADVIL,MOTRIN) 200 MG tablet Take 1-4 tablets (200-800  mg total) by mouth every 6 (six) hours as needed for headache or mild pain.   . Multiple Vitamin (MULTIVITAMIN) tablet Take 1 tablet by mouth daily.   . norethindrone-ethinyl estradiol (JUNEL FE 1/20) 1-20 MG-MCG tablet Take 1 tablet by mouth daily.   . Probiotic Product (ALIGN) 4 MG CAPS Take 4 mg by mouth daily before breakfast.    . Vitamin D, Ergocalciferol, (DRISDOL) 50000 units CAPS capsule TAKE 1 CAPSULE (50,000 UNITS TOTAL) BY MOUTH EVERY 7 (SEVEN) DAYS.   Marland Kitchen. dexlansoprazole (DEXILANT) 60 MG capsule Take 1 capsule (60 mg total) by mouth daily.  (Patient not taking: Reported on 02/07/2018) 02/07/2018: As per pt, med was too expensive   . hyoscyamine (LEVSIN SL) 0.125 MG SL tablet Place 1 tablet (0.125 mg total) under the tongue 2 (two) times daily. (Patient not taking: Reported on 02/07/2018) 02/07/2018: As per pt, med was too expensive   No facility-administered encounter medications on file as of 02/07/2018.    No Known Allergies    Review of Systems:  Constitutional: No fever/chills  Cardiac: No  chest pain, No  pressure, No palpitations  Respiratory:  No  shortness of breath. No  Cough  Gastrointestinal: +abdominal pain, +change on bowel habits  Musculoskeletal: No new myalgia/arthralgia  Skin: No  Rash  Hem/Onc: No  easy bruising/bleeding, No  abnormal lumps/bumps  Neurologic: No  weakness, No  Dizziness  Psychiatric: No  concerns with depression, No  concerns with anxiety  Exam:  BP 129/74 (BP Location: Left Arm, Patient Position: Sitting, Cuff Size: Normal)   Pulse 87   Temp 98.2 F (36.8 C) (Oral)   Wt 149 lb 14.4 oz (68 kg)   LMP 01/28/2018   BMI 24.57 kg/m   Constitutional: VS see above. General Appearance: alert, well-developed, well-nourished, NAD  Eyes: Normal lids and conjunctive, non-icteric sclera  Ears, Nose, Mouth, Throat: MMM, Normal external inspection ears/nares/mouth/lips/gums.  Neck: No masses, trachea midline.   Respiratory: Normal respiratory effort. no wheeze, no rhonchi, no rales  Cardiovascular: S1/S2 normal, no murmur, no rub/gallop auscultated. RRR.   Musculoskeletal: Gait normal. Symmetric and independent movement of all extremities  Abdominal: Bowel sounds within normal limits x4 quadrants, mild tenderness right lower quadrant consistent with area tympanic to percussion, rectal exam was deferred  Neurological: Normal balance/coordination. No tremor.  Skin: warm, dry, intact.   Psychiatric: Normal judgment/insight. Normal mood and affect. Oriented x3.   Visit summary with  medication list and pertinent instructions was printed for patient to review, advised to alert us if any changes needed. All questions at time of visit were answered - patient instructed to contact office with any additional concerns. ER/RTC precautions were reviewed with the patient and understanding verbalized.   Follow-up plan: Return if symptoms worsen or fail to improve.    Please note: voice recognition software was used to produce this document, and typos may escape review. Please contact Dr. Lyn HollingsheadAlexander for any needed clarifications.

## 2018-02-09 ENCOUNTER — Encounter: Payer: Self-pay | Admitting: Osteopathic Medicine

## 2018-02-11 ENCOUNTER — Other Ambulatory Visit: Payer: Self-pay | Admitting: Obstetrics & Gynecology

## 2018-02-14 ENCOUNTER — Encounter: Payer: Self-pay | Admitting: Obstetrics & Gynecology

## 2018-03-01 ENCOUNTER — Other Ambulatory Visit: Payer: Self-pay | Admitting: Osteopathic Medicine

## 2018-04-29 ENCOUNTER — Telehealth: Payer: Self-pay

## 2018-04-29 MED ORDER — HYOSCYAMINE SULFATE 0.125 MG SL SUBL: 0.1250 mg | SUBLINGUAL_TABLET | Freq: Two times a day (BID) | SUBLINGUAL | 0 refills | Status: DC

## 2018-04-29 NOTE — Telephone Encounter (Signed)
1 month RF sent- then pt will need to f/u on SX if still requesting medication

## 2018-06-03 ENCOUNTER — Encounter: Payer: Self-pay | Admitting: Cardiology

## 2018-06-24 ENCOUNTER — Ambulatory Visit: Payer: BLUE CROSS/BLUE SHIELD | Admitting: Cardiology

## 2018-07-13 ENCOUNTER — Other Ambulatory Visit: Payer: Self-pay | Admitting: Cardiology

## 2018-07-13 MED ORDER — DILTIAZEM HCL ER COATED BEADS 240 MG PO CP24: 240.0000 mg | ORAL_CAPSULE | Freq: Every day | ORAL | 1 refills | Status: DC

## 2018-08-15 IMAGING — US US ABDOMEN COMPLETE
1 series · 14 of 25 positions shown · non-contrast
Comparison: 03/17/2013 right upper quadrant ultrasound

CLINICAL DATA: Abnormal liver function tests. Abdominal pain since
[REDACTED]. Left upper quadrant pain and dyspepsia.

EXAM:
ABDOMEN ULTRASOUND COMPLETE

[Series 1: us abdomen complete · 0.22mm/px · 14 of 67 slices shown]
[im 1/67]
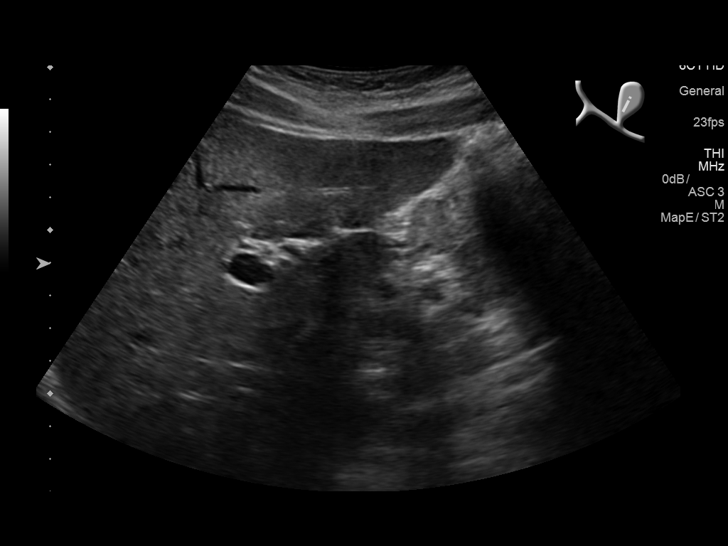
[im 6/67]
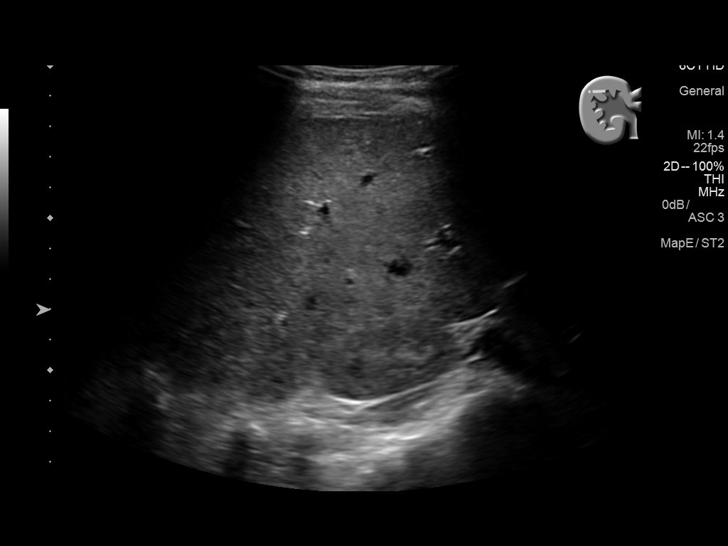
[im 12/67]
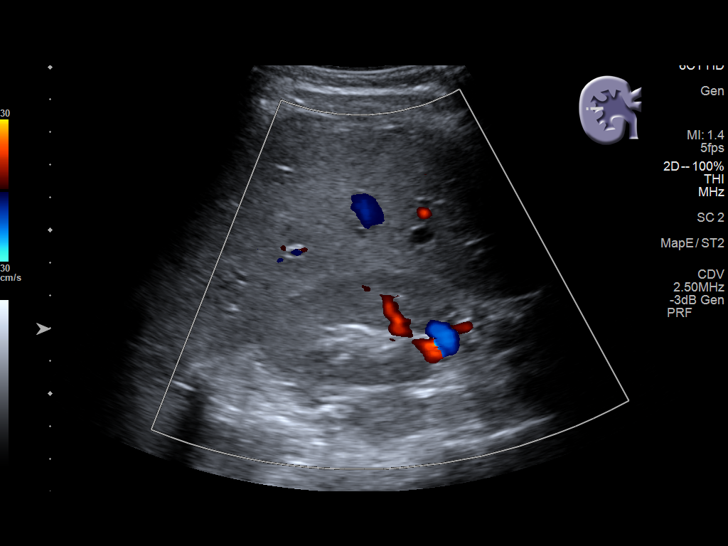
[im 17/67]
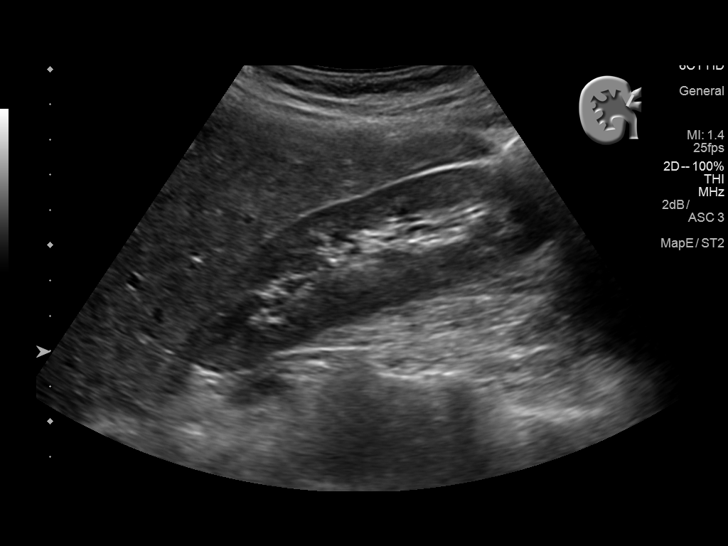
[im 23/67]
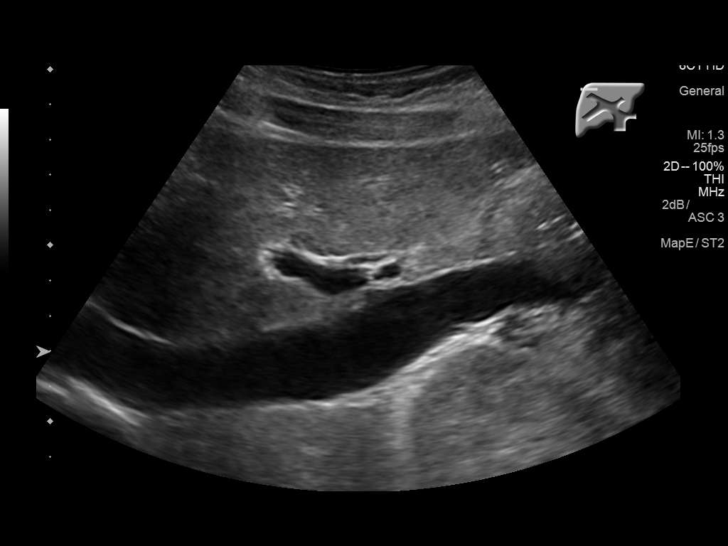
[im 25/67]
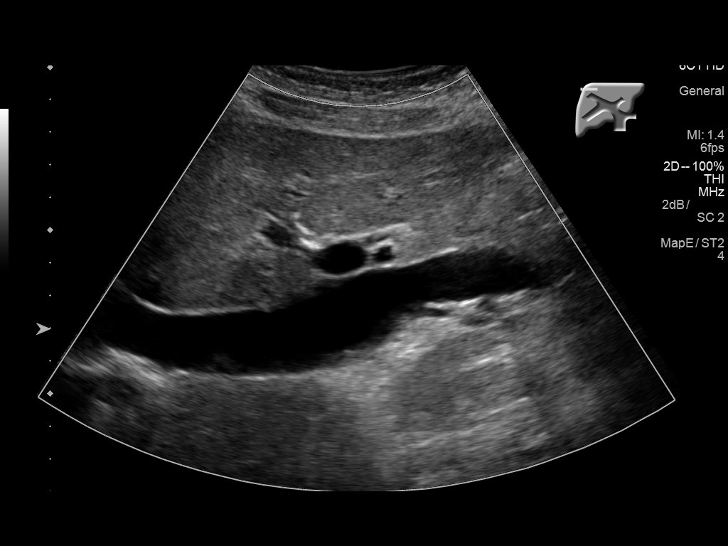
[im 31/67]
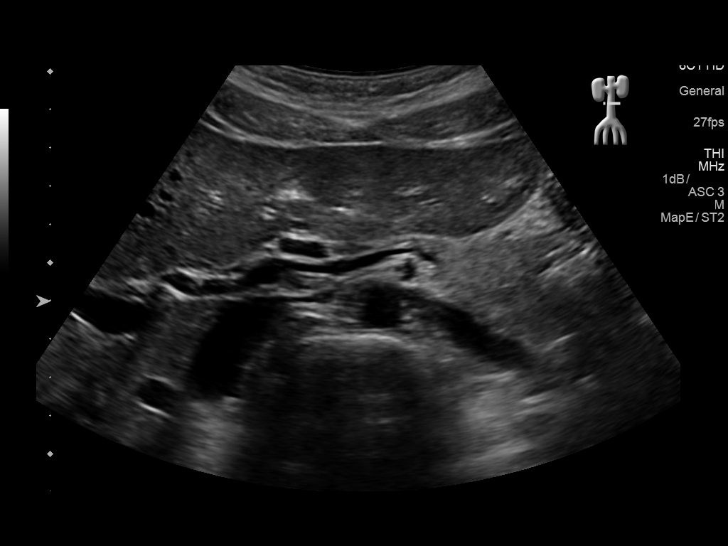
[im 36/67]
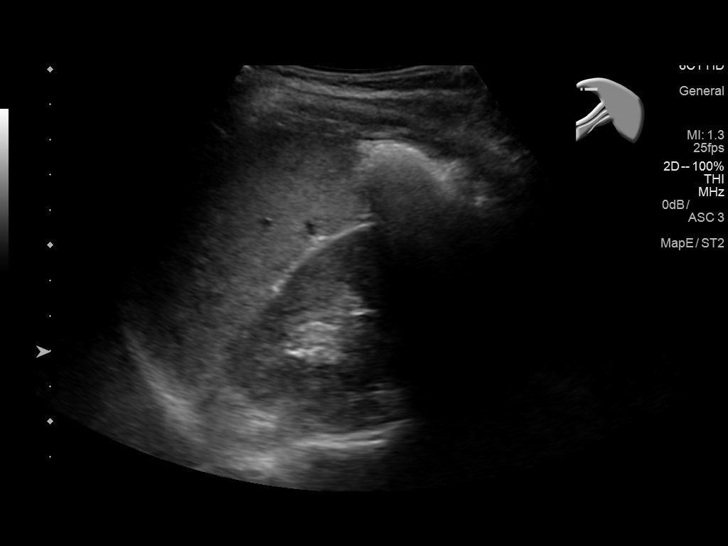
[im 42/67]
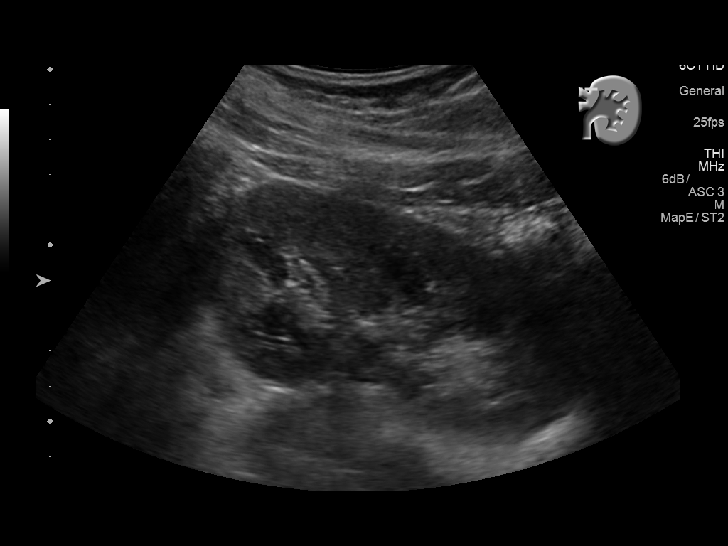
[im 45/67]
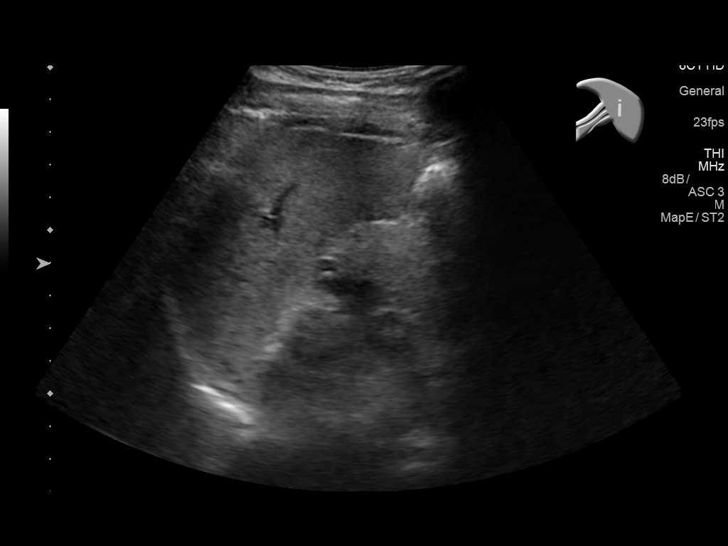
[im 50/67]
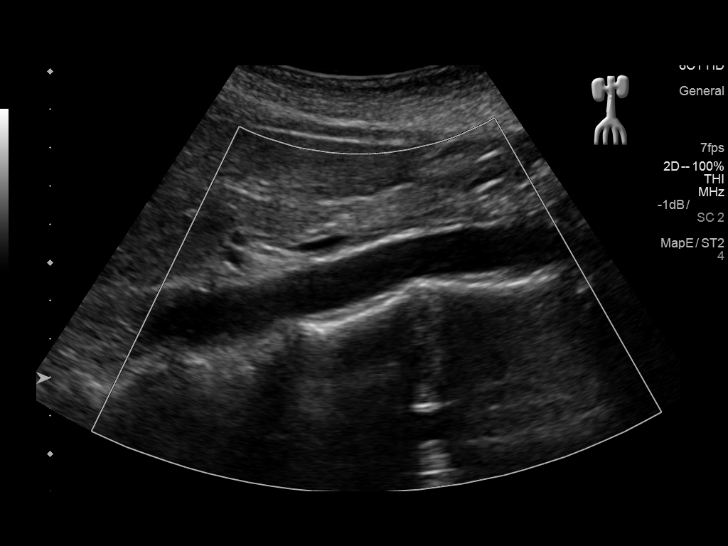
[im 56/67]
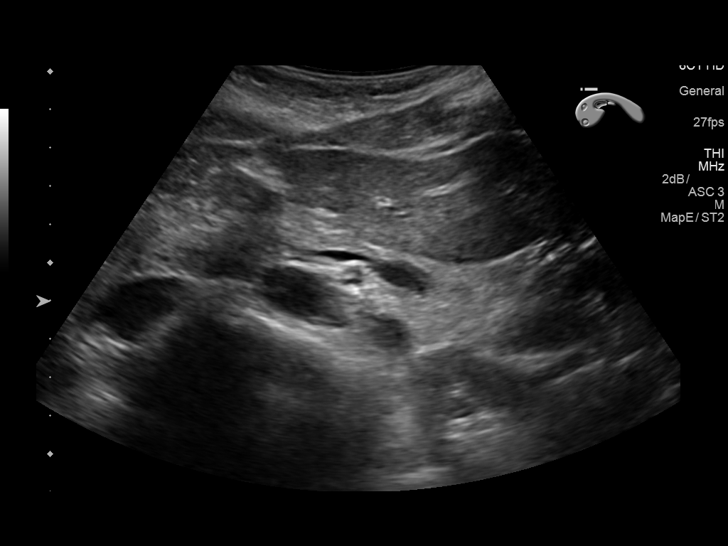
[im 61/67]
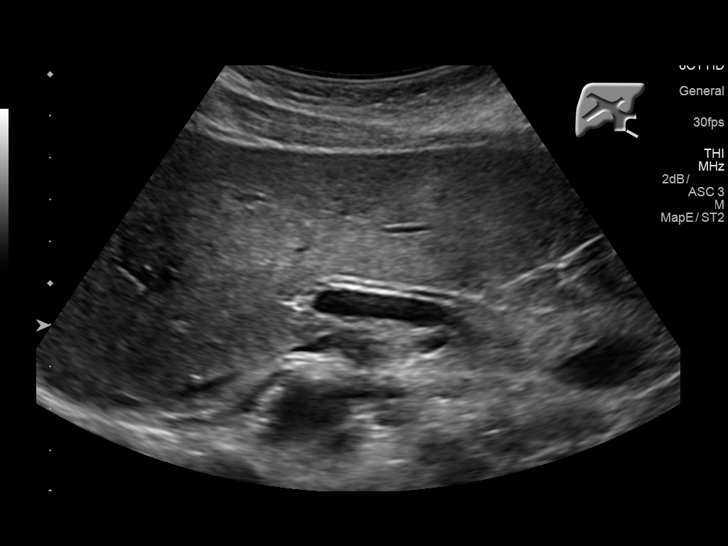
[im 67/67]
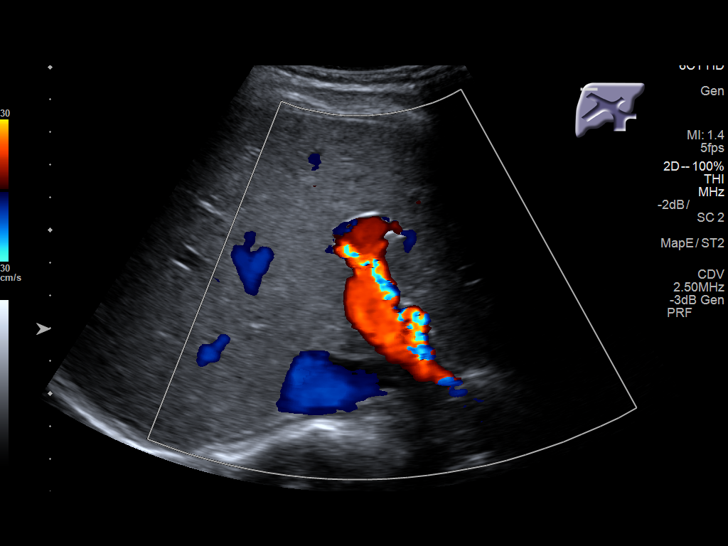

[14 of 25 positions shown; findings below may reference images not displayed]

FINDINGS: Gallbladder: Surgically absent.

Common bile duct: Diameter: 2 mm

Liver: No focal lesion identified. Within normal limits in
parenchymal echogenicity.

IVC: No abnormality visualized.

Pancreas: Visualized portion unremarkable.

Spleen: Size and appearance within normal limits.

Right Kidney: Length: 10.6 cm. Echogenicity within normal limits. No
mass or hydronephrosis visualized.

Left Kidney: Length: 11.3 cm. Echogenicity within normal limits. No
mass or hydronephrosis visualized.

Abdominal aorta: No aneurysm visualized.

Other findings: None.
IMPRESSION: Prior cholecystectomy, otherwise unremarkable abdominal ultrasound.

## 2018-08-16 ENCOUNTER — Other Ambulatory Visit: Payer: Self-pay

## 2018-08-16 ENCOUNTER — Encounter: Payer: Self-pay | Admitting: Cardiology

## 2018-08-16 ENCOUNTER — Telehealth (INDEPENDENT_AMBULATORY_CARE_PROVIDER_SITE_OTHER): Payer: BLUE CROSS/BLUE SHIELD | Admitting: Cardiology

## 2018-08-16 DIAGNOSIS — G909 Disorder of the autonomic nervous system, unspecified: Secondary | ICD-10-CM | POA: Diagnosis not present

## 2018-08-16 NOTE — Progress Notes (Signed)
Electrophysiology TeleHealth Note   Due to national recommendations of social distancing due to COVID 19, an audio/video telehealth visit is felt to be most appropriate for this patient at this time.  See MyChart message from today for the patient's consent to telehealth for Carla Griffin.   Date:  08/16/2018   ID:  Carla Griffin, DOB Jul 23, 1980, MRN [id number]  Location: patient's home  Provider location: [address] [place]  Evaluation Performed: Follow-up visit  PCP:  [name], DO  Cardiologist:  Anne Fu Electrophysiologist:  Neida Ellegood [name], MD   Chief Complaint:  palpitations  History of Present Illness:    Carla Griffin is a 38 y.o. female who presents via Web designer for a telehealth visit today.  Since last being seen in our clinic, the patient reports doing very well.  Today, she denies symptoms of palpitations, chest pain, shortness of breath,  lower extremity edema, dizziness, presyncope, or syncope.  The patient is otherwise without complaint today.  The patient denies symptoms of fevers, chills, cough, or new SOB worrisome for COVID 19.   She has a history of autonomic dysfunction.  She also had nonsustained VT found on cardiac monitoring with a normal echocardiogram.  She currently takes diltiazem.  She is currently feeling well.  She has symptoms intermittently.  She has noticed that her symptoms mainly lined up with her menstrual cycle.  She also says that her symptoms are worse when she works long days or when it is hot outside.  Overall though, she is quite comfortable with her symptomatology does not feel like she needs any therapy changes.  Past Medical History:  Diagnosis Date  . Gallstones   . GERD (gastroesophageal reflux disease)   . IBS (irritable bowel syndrome)   . Pneumonia   . POTS (postural orthostatic tachycardia syndrome)   . Urinary tract infection     Past Surgical History:  Procedure Laterality Date   . BRAVO PH STUDY N/A 03/03/2016   Procedure: BRAVO PH STUDY;  Surgeon: Napoleon Form, MD;  Location: [facility];  Service: Endoscopy;  Laterality: N/A;  . CHOLECYSTECTOMY  2014  . ESOPHAGOGASTRODUODENOSCOPY (EGD) WITH PROPOFOL N/A 03/03/2016   Procedure: ESOPHAGOGASTRODUODENOSCOPY (EGD) WITH PROPOFOL;  Surgeon: Napoleon Form, MD;  Location: [facility];  Service: Endoscopy;  Laterality: N/A;  . WISDOM TOOTH EXTRACTION      Current Outpatient Medications  Medication Sig Dispense Refill  . acetaminophen (TYLENOL) 500 MG tablet Take 500 mg by mouth daily as needed for mild pain or headache.     . dexlansoprazole (DEXILANT) 60 MG capsule Take 1 capsule (60 mg total) by mouth daily. 30 capsule 3  . diltiazem (CARDIZEM CD) 240 MG 24 hr capsule Take 1 capsule (240 mg total) by mouth daily. 90 capsule 1  . esomeprazole (NEXIUM) 40 MG capsule Take 1 capsule (40 mg total) by mouth 2 (two) times daily before a meal. 180 capsule 3  . hyoscyamine (LEVSIN SL) 0.125 MG SL tablet Place 1 tablet (0.125 mg total) under the tongue 2 (two) times daily. 60 tablet 0  . ibuprofen (ADVIL,MOTRIN) 200 MG tablet Take 1-4 tablets (200-800 mg total) by mouth every 6 (six) hours as needed for headache or mild pain. 30 tablet 0  . Multiple Vitamin (MULTIVITAMIN) tablet Take 1 tablet by mouth daily.    . norethindrone-ethinyl estradiol (JUNEL FE 1/20) 1-20 MG-MCG tablet Take 1 tablet by mouth daily. 84 tablet 5  . Probiotic Product (ALIGN)  4 MG CAPS Take 4 mg by mouth daily before breakfast.     . RABEprazole (ACIPHEX) 20 MG tablet TAKE 1 TABLET BY MOUTH EVERY DAY 90 tablet 1  . Vitamin D, Ergocalciferol, (DRISDOL) 50000 units CAPS capsule TAKE 1 CAPSULE (50,000 UNITS TOTAL) BY MOUTH EVERY 7 (SEVEN) DAYS. 8 capsule 0   No current facility-administered medications for this visit.     Allergies:   Patient has no known allergies.   Social History:  The patient  reports that she has never smoked. She has never  used smokeless tobacco. She reports current alcohol use. She reports that she does not use drugs.   Family History:  The patient's  family history includes Diabetes in her maternal grandfather; Heart attack in her maternal grandfather; Hyperlipidemia in her father; Hypertension in her father; Lupus in her mother; Stroke in her paternal grandfather.   ROS:  Please see the history of present illness.   All other systems are personally reviewed and negative.    Exam:    Vital Signs:  BP 97/69   Pulse 86   Wt 153 lb (69.4 kg)   BMI 25.07 kg/m    Telephone note performed.  Patient appears comfortable over the phone.   Labs/Other Tests and Data Reviewed:    Recent Labs: 02/02/2018: TSH 2.28   Wt Readings from Last 3 Encounters:  08/16/18 153 lb (69.4 kg)  02/07/18 149 lb 14.4 oz (68 kg)  02/02/18 150 lb (68 kg)     Other studies personally reviewed: Additional studies/ records that were reviewed today include: ECG 12/23/17  Review of the above records today demonstrates: SR, rate 88   ASSESSMENT & PLAN:    1.  Autonomic dysfunction: Currently on diltiazem.  She is continued to have intermittent symptoms of palpitations and dizziness.  That being said she is quite comfortable with her current treatment regimen.  We [name] make no changes.  2.  Nonsustained VT: Found on cardiac monitoring.  Echo is without major abnormality.  No changes.  3.  PACs: Diltiazem was recently increased.  Continue to have mild palpitations.  No changes.  COVID 19 screen The patient denies symptoms of COVID 19 at this time.  The importance of social distancing was discussed today.  Follow-up: 6 months  Current medicines are reviewed at length with the patient today.   The patient does not have concerns regarding her medicines.  The following changes were made today:  none  Labs/ tests ordered today include:  No orders of the defined types were placed in this encounter.    Patient Risk:  after full  review of this patients clinical status, I feel that they are at moderate risk at this time.  Today, I have spent 12 minutes with the patient with telehealth technology discussing autonomic dysfunction.    Signed, Milan Clare [name], MD  08/16/2018 10:41 AM     [facility] [address] [place] [address] [phone] (office) [phone] (fax)

## 2018-10-28 ENCOUNTER — Other Ambulatory Visit: Payer: Self-pay | Admitting: Obstetrics & Gynecology

## 2018-10-28 DIAGNOSIS — Z3041 Encounter for surveillance of contraceptive pills: Secondary | ICD-10-CM

## 2018-10-28 DIAGNOSIS — N951 Menopausal and female climacteric states: Secondary | ICD-10-CM

## 2019-01-04 ENCOUNTER — Other Ambulatory Visit: Payer: Self-pay | Admitting: Cardiology

## 2019-01-11 DIAGNOSIS — U071 COVID-19: Secondary | ICD-10-CM | POA: Diagnosis not present

## 2019-04-14 ENCOUNTER — Encounter: Payer: Self-pay | Admitting: Cardiology

## 2019-04-14 ENCOUNTER — Other Ambulatory Visit: Payer: Self-pay

## 2019-04-14 ENCOUNTER — Ambulatory Visit: Payer: BLUE CROSS/BLUE SHIELD | Admitting: Cardiology

## 2019-04-14 VITALS — BP 138/80 | HR 85 | Ht 66.0 in | Wt 158.0 lb

## 2019-04-14 DIAGNOSIS — R000 Tachycardia, unspecified: Secondary | ICD-10-CM

## 2019-04-14 NOTE — Progress Notes (Signed)
Electrophysiology Office Note   Date:  04/14/2019   ID:  Carla Griffin, DOB [birth year], MRN [id number]  PCP:  [name], DO  Cardiologist:  Anne Fu Primary Electrophysiologist:  Nishi Neiswonger [name], MD    No chief complaint on file.    History of Present Illness: Carla Griffin is a 38 y.o. female who is being seen today for the evaluation of PVCs at the request of [name], DO. Presenting today for electrophysiology evaluation. 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 She has a history of GERD, gallstones status post cholecystectomy, IBS, and PVCs. She had an episode of syncope. She saw her primary physician in July 2018 with an episode of syncope and a diagnosis of pots. She had orthostatic vital signs with elevated heart rates and reproduction of milder symptoms with postural changes. The beginning of 2018, and starting her job in of onset, she was standing up giving report when she felt hot, nauseated, weak and nearly passed out. Blood pressure was 70s over 40s. Symptoms recurred when she sat down. She has had more lightheadedness when standing.He has been increasing her salt and fluid intake.   She presented to the hospital on 06/13/17 with complaints of palpitations.  She was found to be in sinus tachycardia.  She was given IV fluids with improvement of her heart rate down into the 70s.  She felt improved and was discharged from the emergency room.  Today, denies symptoms of palpitations, chest pain, shortness of breath, orthopnea, PND, lower extremity edema, claudication, dizziness, presyncope, syncope, bleeding, or neurologic sequela. The patient is tolerating medications without difficulties.  She is continued to have episodes of tachycardia and lightheadedness.  She is feeling well today.  She is happy with her overall control with her current dose of diltiazem.  I did tell her that we could increase the dose if necessary.  She has continued to have headaches.  I have told her that  she can increase her ibuprofen dose to 600 mg every 8 hours.  She can also take Tylenol for her headaches.  A few weeks ago she did have an episode of chest pain.  The pain occurred at work and lasted most of the day.  She said that it did not improve when she sat down to rest.  It did not worsen with exertion.   Past Medical History:  Diagnosis Date  . Gallstones   . GERD (gastroesophageal reflux disease)   . IBS (irritable bowel syndrome)   . Pneumonia   . POTS (postural orthostatic tachycardia syndrome)   . Urinary tract infection    Past Surgical History:  Procedure Laterality Date  . BRAVO PH STUDY N/A 03/03/2016   Procedure: BRAVO PH STUDY;  Surgeon: Napoleon Form, MD;  Location: [facility];  Service: Endoscopy;  Laterality: N/A;  . CHOLECYSTECTOMY  2014  . ESOPHAGOGASTRODUODENOSCOPY (EGD) WITH PROPOFOL N/A 03/03/2016   Procedure: ESOPHAGOGASTRODUODENOSCOPY (EGD) WITH PROPOFOL;  Surgeon: Napoleon Form, MD;  Location: [facility];  Service: Endoscopy;  Laterality: N/A;  . WISDOM TOOTH EXTRACTION       Current Outpatient Medications  Medication Sig Dispense Refill  . acetaminophen (TYLENOL) 500 MG tablet Take 500 mg by mouth daily as needed for mild pain or headache.     . diltiazem (CARDIZEM CD) 240 MG 24 hr capsule TAKE 1 CAPSULE BY MOUTH EVERY DAY 90 capsule 1  . esomeprazole (NEXIUM) 40 MG capsule Take 1 capsule (40 mg total) by mouth 2 (two) times  daily before a meal. 180 capsule 3  . hyoscyamine (LEVSIN SL) 0.125 MG SL tablet Place 1 tablet (0.125 mg total) under the tongue 2 (two) times daily. 60 tablet 0  . ibuprofen (ADVIL,MOTRIN) 200 MG tablet Take 1-4 tablets (200-800 mg total) by mouth every 6 (six) hours as needed for headache or mild pain. 30 tablet 0  . JUNEL FE 1/20 1-20 MG-MCG tablet TAKE 1 TABLET BY MOUTH EVERY DAY 84 tablet 5  . Multiple Vitamin (MULTIVITAMIN) tablet Take 1 tablet by mouth daily.    . Probiotic Product (ALIGN) 4 MG CAPS Take 4 mg by  mouth daily before breakfast.      No current facility-administered medications for this visit.     Allergies:   Patient has no known allergies.   Social History:  The patient  reports that she has never smoked. She has never used smokeless tobacco. She reports current alcohol use. She reports that she does not use drugs.   Family History:  The patient's family history includes Diabetes in her maternal grandfather; Heart attack in her maternal grandfather; Hyperlipidemia in her father; Hypertension in her father; Lupus in her mother; Stroke in her paternal grandfather.   ROS:  Please see the history of present illness.   Otherwise, review of systems is positive for none.   All other systems are reviewed and negative.   PHYSICAL EXAM: VS:  BP 138/80   Pulse 85   Ht 5\' 6"  (1.676 m)   Wt 158 lb (71.7 kg)   SpO2 97%   BMI 25.50 kg/m  , BMI Body mass index is 25.5 kg/m. GEN: Well nourished, well developed, in no acute distress  HEENT: normal  Neck: no JVD, carotid bruits, or masses Cardiac: RRR; no murmurs, rubs, or gallops,no edema  Respiratory:  clear to auscultation bilaterally, normal work of breathing GI: soft, nontender, nondistended, + BS MS: no deformity or atrophy  Skin: warm and dry Neuro:  Strength and sensation are intact Psych: euthymic mood, full affect  EKG:  EKG is ordered today. Personal review of the ekg ordered shows sinus rhythm, rate 85  Recent Labs: No results found for requested labs within last 8760 hours.    Lipid Panel     Component Value Date/Time   CHOL 171 08/16/2009 0916   TRIG 46.0 08/16/2009 0916   HDL 68.70 08/16/2009 0916   CHOLHDL 2 08/16/2009 0916   VLDL 9.2 08/16/2009 0916   LDLCALC 93 08/16/2009 0916     Wt Readings from Last 3 Encounters:  04/14/19 158 lb (71.7 kg)  08/16/18 153 lb (69.4 kg)  02/07/18 149 lb 14.4 oz (68 kg)      Other studies Reviewed: Additional studies/ records that were reviewed today include: TTE 12/30/16   Review of the above records today demonstrates:  - Left ventricle: The cavity size was normal. Systolic function was   normal. The estimated ejection fraction was in the range of 60%   to 65%. Wall motion was normal; there were no regional wall   motion abnormalities. Left ventricular diastolic function   parameters were normal. - Aortic valve: Trileaflet; normal thickness leaflets. There was no   regurgitation. - Aortic root: The aortic root was normal in size. - Mitral valve: Valve area by pressure half-time: 2.29 cm^2. - Left atrium: The atrium was normal in size. - Right ventricle: Systolic function was normal. - Right atrium: The atrium was normal in size. - Tricuspid valve: There was trivial regurgitation. - Pulmonary  arteries: Systolic pressure was within the normal   range. - Inferior vena cava: The vessel was normal in size. The   respirophasic diameter changes were in the normal range (= 50%),   consistent with normal central venous pressure. - Pericardium, extracardiac: There was no pericardial effusion.  Monitor 02/03/17 - personally reviewed  On 01-24-17 at 4:14 PM patient experienced 13 beats of ventricular tachycardia - she experienced flutter sensation. Did not have syncope with this episode.  She did experience syncope at times during her event monitor, all of which correlated with sinus rhythm/sinus tachycardia  No atrial fibrillation, no pauses. Very rare PVC  ASSESSMENT AND PLAN:  1.  Autonomic dysfunction: The cause of most of her palpitations.  She is on diltiazem 240 mg.  Also has increased her fluid intake.  She is currently feeling well.  She has a few episodes of palpitations and lightheadedness, but is currently well controlled with her current dose of diltiazem.  2. Nonsustained VT: No further obvious episodes.  3.  APCs: Controlled with diltiazem  4.  Chest pain: At this point is not seem that her chest pain is cardiac.  This would be very atypical for  angina.  She also does not have any coronary artery disease risk factors.  Due to that, we Ulmer Degen plan to hold off on further testing.  I told her that if her chest pain recurs, that we Tremon Sainvil look into stress testing.  Current medicines are reviewed at length with the patient today.   The patient does not have concerns regarding her medicines.  The following changes were made today: None  Labs/ tests ordered today include:  Orders Placed This Encounter  Procedures  . EKG 12-Lead     Disposition:   FU with Tanis Hensarling 6 months  Signed, Eupha Lobb Jorja LoaMartin Yvonnia Tango, MD  04/14/2019 10:50 AM     [facility] [address] [place] [address] [phone] (office) [phone] (fax)

## 2019-04-26 ENCOUNTER — Encounter: Payer: Self-pay | Admitting: Osteopathic Medicine

## 2019-04-28 ENCOUNTER — Ambulatory Visit (INDEPENDENT_AMBULATORY_CARE_PROVIDER_SITE_OTHER): Payer: BLUE CROSS/BLUE SHIELD | Admitting: Physician Assistant

## 2019-04-28 VITALS — BP 104/69 | HR 86 | Temp 98.7°F | Ht 66.0 in | Wt 153.0 lb

## 2019-04-28 DIAGNOSIS — R079 Chest pain, unspecified: Secondary | ICD-10-CM | POA: Diagnosis not present

## 2019-04-28 DIAGNOSIS — J320 Chronic maxillary sinusitis: Secondary | ICD-10-CM

## 2019-04-28 MED ORDER — AMOXICILLIN-POT CLAVULANATE 875-125 MG PO TABS: 1.0000 | ORAL_TABLET | Freq: Two times a day (BID) | ORAL | 0 refills | Status: DC

## 2019-04-28 MED ORDER — METHYLPREDNISOLONE 4 MG PO TBPK: ORAL_TABLET | ORAL | 0 refills | Status: DC

## 2019-04-28 NOTE — Progress Notes (Signed)
Patient ID: Carla Griffin, female   DOB: [birth year], 38 y.o.   MRN: [id number] .Marland KitchenVirtual Visit via Video Note  I connected with Kasidy K Gerling on 05/01/19 at 10:50 AM EST by a video enabled telemedicine application and verified that I am speaking with the correct person using two identifiers.  Location: Patient: home Provider: clinic   I discussed the limitations of evaluation and management by telemedicine and the availability of in person appointments. The patient expressed understanding and agreed to proceed.  History of Present Illness:  Pt is a 38 yo female with POTS who calls into the clinic to discuss worsening of headaches.  Patient has a history of intermittent headaches with her POTS.  Usually intermittent ibuprofen and Tylenol help with symptoms.  For the last week she has had more severe constant right-sided headache, sinus pressure, teeth pain, facial pain.  She denies any nausea or vomiting.  She is sensitive to sound and light.  She does feel like activity makes it worse. It is also worse in morning and evenings.   She denies any fever, chills, cough, shortness of breath.  She denies any vision changes.  She also mentions having some chest pain with activity.  She recently saw cardiology and he did not think it was cardiac related.  Symptoms are intermittent.   .. Active Ambulatory Problems    Diagnosis Date Noted  . Vitamin D deficiency 05/06/2009  . BACK PAIN WITH RADICULOPATHY 02/15/2009  . Left-sided thoracic back pain 04/29/2015  . Fatigue 04/29/2015  . Palpitations 08/14/2015  . Gastroesophageal reflux disease without esophagitis 08/14/2015  . Left sided abdominal pain 01/08/2016  . Nausea without vomiting 01/08/2016  . Loose stools 01/08/2016  . Generalized abdominal cramps 01/08/2016  . Early satiety 01/08/2016  . Swallowing difficulty 01/08/2016  . Elevated ALT measurement 01/08/2016  . Heartburn   . Dyspepsia   . Gynecologic exam normal 06/23/2016  .  Contraception management 06/23/2016  . Perimenopausal vasomotor symptoms 06/23/2016  . Postural orthostatic tachycardia syndrome 11/23/2016   Resolved Ambulatory Problems    Diagnosis Date Noted  . AMENORRHEA 12/09/2009  . ARTHRALGIA 08/28/2009  . FATIGUE 02/15/2009  . Pharyngitis 04/19/2011  . RUQ abdominal pain 03/17/2013  . Acute bronchitis 06/25/2014  . Acute frontal sinusitis 09/27/2014   Past Medical History:  Diagnosis Date  . Gallstones   . GERD (gastroesophageal reflux disease)   . IBS (irritable bowel syndrome)   . Pneumonia   . POTS (postural orthostatic tachycardia syndrome)   . Urinary tract infection    Reviewed med, allergy, problem list.    Observations/Objective: No acute distress. Normal breathing. No wheezing, cough.  Normal appearance and mood.   .. Today's Vitals   04/28/19 0903  BP: 104/69  Pulse: 86  Temp: 98.7 F (37.1 C)  TempSrc: Oral  Weight: 153 lb (69.4 kg)  Height: 5\' 6"  (1.676 m)   Body mass index is 24.69 kg/m.    Assessment and Plan: Marland KitchenApril was seen today for headache.  Diagnoses and all orders for this visit:  Right maxillary sinusitis -     methylPREDNISolone (MEDROL DOSEPAK) 4 MG TBPK tablet; Use as directed by package insert. -     amoxicillin-clavulanate (AUGMENTIN) 875-125 MG tablet; Take 1 tablet by mouth 2 (two) times daily.  Chest pain, unspecified type   I feel like today patient's headache could be more sinusitis related.  Treated with Medrol Dosepak and Augmentin.  Not feeling better in the next 5 to 7 days  or if worsening please contact clinic.  Discussed other symptomatic care with Flonase.  She just saw her cardiologist.  I read the documentation on her chest pain.  He does not suspect that it is cardiac related.  He did place in his note she still continues to have symptoms that he would consider ordering a stress test.  I advised her to follow-up with cardiology regarding this.   Follow Up  Instructions:    I discussed the assessment and treatment plan with the patient. The patient was provided an opportunity to ask questions and all were answered. The patient agreed with the plan and demonstrated an understanding of the instructions.   The patient was advised to call back or seek an in-person evaluation if the symptoms worsen or if the condition fails to improve as anticipated.  Iran Planas, PA-C

## 2019-04-28 NOTE — Progress Notes (Deleted)
Headaches couple times a month due to POTS, treats with ibuprofen Since last Tuesday headache all day, every day Has taken tylenol and ibuprofen No nausea/vomiting, some sensitivity to light and sound Worse with activity, worse in afternoon/evening  Having chest pressure with activity - lasted 4-5 days, now every once in a while (happened after her follow up with Cardiology)

## 2019-05-01 ENCOUNTER — Encounter: Payer: Self-pay | Admitting: Physician Assistant

## 2019-06-29 ENCOUNTER — Other Ambulatory Visit: Payer: Self-pay | Admitting: Cardiology

## 2019-06-29 MED ORDER — DILTIAZEM HCL ER COATED BEADS 240 MG PO CP24: ORAL_CAPSULE | ORAL | 3 refills | Status: DC

## 2019-10-24 ENCOUNTER — Other Ambulatory Visit: Payer: Self-pay

## 2019-10-24 ENCOUNTER — Ambulatory Visit: Payer: BC Managed Care – PPO | Admitting: Cardiology

## 2019-10-24 ENCOUNTER — Encounter: Payer: Self-pay | Admitting: Cardiology

## 2019-10-24 VITALS — BP 126/82 | HR 87 | Ht 66.0 in | Wt 152.0 lb

## 2019-10-24 DIAGNOSIS — G909 Disorder of the autonomic nervous system, unspecified: Secondary | ICD-10-CM

## 2019-10-24 NOTE — Progress Notes (Signed)
Electrophysiology Office Note   Date:  10/24/2019   ID:  Carla Griffin, [place] May 06, 1981, MRN [id number]  PCP:  Carla Nielsen, DO  Cardiologist:  Carla Griffin Primary Electrophysiologist:  Carla Griffin Carla Loa, MD    No chief complaint on file.    History of Present Illness: Carla Griffin is a 39 y.o. female who is being seen today for the evaluation of PVCs at the request of Carla Nielsen, DO. Presenting today for electrophysiology evaluation. 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 She has a history of GERD, gallstones status post cholecystectomy, IBS, and PVCs. She had an episode of syncope. She saw her primary physician in July 2018 with an episode of syncope and a diagnosis of pots. She had orthostatic vital signs with elevated heart rates and reproduction of milder symptoms with postural changes. The beginning of 2018, and starting her job in of onset, she was standing up giving report when she felt hot, nauseated, weak and nearly passed out. Blood pressure was 70s over 40s. Symptoms recurred when she sat down. She has had more lightheadedness when standing.He has been increasing her salt and fluid intake.   She presented to the hospital on 06/13/17 with complaints of palpitations.  She was found to be in sinus tachycardia.  She was given IV fluids with improvement of her heart rate down into the 70s.  She felt improved and was discharged from the emergency room.  Today, denies symptoms of palpitations, chest pain, shortness of breath, orthopnea, PND, lower extremity edema, claudication, dizziness, presyncope, syncope, bleeding, or neurologic sequela. The patient is tolerating medications without difficulties.  She has been feeling well.  She does continue to have intermittent palpitations, but overall she has no major complaints.  She continues to exercise which she feels is helping.   Past Medical History:  Diagnosis Date  . Gallstones   . GERD (gastroesophageal reflux disease)   . IBS (irritable  bowel syndrome)   . Pneumonia   . POTS (postural orthostatic tachycardia syndrome)   . Urinary tract infection    Past Surgical History:  Procedure Laterality Date  . BRAVO PH STUDY N/A 03/03/2016   Procedure: BRAVO PH STUDY;  Surgeon: Carla Form, MD;  Location: [facility];  Service: Endoscopy;  Laterality: N/A;  . CHOLECYSTECTOMY  2014  . ESOPHAGOGASTRODUODENOSCOPY (EGD) WITH PROPOFOL N/A 03/03/2016   Procedure: ESOPHAGOGASTRODUODENOSCOPY (EGD) WITH PROPOFOL;  Surgeon: Carla Form, MD;  Location: [facility];  Service: Endoscopy;  Laterality: N/A;  . WISDOM TOOTH EXTRACTION       Current Outpatient Medications  Medication Sig Dispense Refill  . acetaminophen (TYLENOL) 500 MG tablet Take 500 mg by mouth daily as needed for mild pain or headache.     . diltiazem (CARDIZEM CD) 240 MG 24 hr capsule TAKE 1 CAPSULE BY MOUTH EVERY DAY 90 capsule 3  . esomeprazole (NEXIUM) 40 MG capsule Take 1 capsule (40 mg total) by mouth 2 (two) times daily before a meal. 180 capsule 3  . ibuprofen (ADVIL,MOTRIN) 200 MG tablet Take 1-4 tablets (200-800 mg total) by mouth every 6 (six) hours as needed for headache or mild pain. 30 tablet 0  . JUNEL FE 1/20 1-20 MG-MCG tablet TAKE 1 TABLET BY MOUTH EVERY DAY 84 tablet 5  . Multiple Vitamin (MULTIVITAMIN) tablet Take 1 tablet by mouth daily.    . Probiotic Product (ALIGN) 4 MG CAPS Take 4 mg by mouth daily before breakfast.      No current facility-administered medications  for this visit.    Allergies:   Patient has no known allergies.   Social History:  The patient  reports that she has never smoked. She has never used smokeless tobacco. She reports current alcohol use. She reports that she does not use drugs.   Family History:  The patient's family history includes Diabetes in her maternal grandfather; Heart attack in her maternal grandfather; Hyperlipidemia in her father; Hypertension in her father; Lupus in her mother; Stroke in her  paternal grandfather.   ROS:  Please see the history of present illness.   Otherwise, review of systems is positive for none.   All other systems are reviewed and negative.   PHYSICAL EXAM: VS:  BP 126/82   Pulse 87   Ht 5\' 6"  (1.676 m)   Wt 152 lb (68.9 kg)   BMI 24.53 kg/m  , BMI Body mass index is 24.53 kg/m. GEN: Well nourished, well developed, in no acute distress  HEENT: normal  Neck: no JVD, carotid bruits, or masses Cardiac: RRR; no murmurs, rubs, or gallops,no edema  Respiratory:  clear to auscultation bilaterally, normal work of breathing GI: soft, nontender, nondistended, + BS MS: no deformity or atrophy  Skin: warm and dry Neuro:  Strength and sensation are intact Psych: euthymic mood, full affect  EKG:  EKG is ordered today. Personal review of the ekg ordered shows sinus rhythm, rate 87  Recent Labs: No results found for requested labs within last 8760 hours.    Lipid Panel     Component Value Date/Time   CHOL 171 08/16/2009 0916   TRIG 46.0 08/16/2009 0916   HDL 68.70 08/16/2009 0916   CHOLHDL 2 08/16/2009 0916   VLDL 9.2 08/16/2009 0916   LDLCALC 93 08/16/2009 0916     Wt Readings from Last 3 Encounters:  10/24/19 152 lb (68.9 kg)  04/28/19 153 lb (69.4 kg)  04/14/19 158 lb (71.7 kg)      Other studies Reviewed: Additional studies/ records that were reviewed today include: TTE 12/30/16  Review of the above records today demonstrates:  - Left ventricle: The cavity size was normal. Systolic function was   normal. The estimated ejection fraction was in the range of 60%   to 65%. Wall motion was normal; there were no regional wall   motion abnormalities. Left ventricular diastolic function   parameters were normal. - Aortic valve: Trileaflet; normal thickness leaflets. There was no   regurgitation. - Aortic root: The aortic root was normal in size. - Mitral valve: Valve area by pressure half-time: 2.29 cm^2. - Left atrium: The atrium was normal in  size. - Right ventricle: Systolic function was normal. - Right atrium: The atrium was normal in size. - Tricuspid valve: There was trivial regurgitation. - Pulmonary arteries: Systolic pressure was within the normal   range. - Inferior vena cava: The vessel was normal in size. The   respirophasic diameter changes were in the normal range (= 50%),   consistent with normal central venous pressure. - Pericardium, extracardiac: There was no pericardial effusion.  Monitor 02/03/17 - personally reviewed  On 01-24-17 at 4:14 PM patient experienced 13 beats of ventricular tachycardia - she experienced flutter sensation. Did not have syncope with this episode.  She did experience syncope at times during her event monitor, all of which correlated with sinus rhythm/sinus tachycardia  No atrial fibrillation, no pauses. Very rare PVC  ASSESSMENT AND PLAN:  1.  Autonomic dysfunction: Likely the cause of most of her palpitations.  She is currently on diltiazem.  Is also been increasing her fluid intake.  Still has minimal symptoms well controlled with diltiazem.  She is exercising.  No other changes.  2. Nonsustained VT: No further episodes  3.  APCs: Controlled with diltiazem  Current medicines are reviewed at length with the patient today.   The patient does not have concerns regarding her medicines.  The following changes were made today: None  Labs/ tests ordered today include:  Orders Placed This Encounter  Procedures  . EKG 12-Lead     Disposition:   Griffin with Adriyanna Christians 12 months  Signed, Latori Beggs Meredith Leeds, MD  10/24/2019 12:15 PM     [address] [place] [address] [phone] (office) [phone] (fax)

## 2019-10-24 NOTE — Patient Instructions (Signed)
Medication Instructions:  Your physician recommends that you continue on your current medications as directed. Please refer to the Current Medication list given to you today.  *If you need a refill on your cardiac medications before your next appointment, please call your pharmacy*   Lab Work: None ordered If you have labs (blood work) drawn today and your tests are completely normal, you will receive your results only by: . MyChart Message (if you have MyChart) OR . A paper copy in the mail If you have any lab test that is abnormal or we need to change your treatment, we will call you to review the results.   Testing/Procedures: None ordered   Follow-Up: At [facility], you and your health needs are our priority.  As part of our continuing mission to provide you with exceptional heart care, we have created designated Provider Care Teams.  These Care Teams include your primary Cardiologist (physician) and Advanced Practice Providers (APPs -  Physician Assistants and Nurse Practitioners) who all work together to provide you with the care you need, when you need it.  We recommend signing up for the patient portal called "MyChart".  Sign up information is provided on this After Visit Summary.  MyChart is used to connect with patients for Virtual Visits (Telemedicine).  Patients are able to view lab/test results, encounter notes, upcoming appointments, etc.  Non-urgent messages can be sent to your provider as well.   To learn more about what you can do with MyChart, go to [email or web address].    Your next appointment:   1 year(s)  The format for your next appointment:   In Person  Provider:   Will Camnitz, MD   Thank you for choosing [facility]!!   Danitza Schoenfeldt, RN [phone]    Other Instructions    

## 2019-11-08 ENCOUNTER — Other Ambulatory Visit: Payer: Self-pay

## 2019-11-08 ENCOUNTER — Encounter: Payer: Self-pay | Admitting: Sports Medicine

## 2019-11-08 ENCOUNTER — Ambulatory Visit (INDEPENDENT_AMBULATORY_CARE_PROVIDER_SITE_OTHER): Payer: BC Managed Care – PPO | Admitting: Sports Medicine

## 2019-11-08 ENCOUNTER — Ambulatory Visit (INDEPENDENT_AMBULATORY_CARE_PROVIDER_SITE_OTHER): Payer: BC Managed Care – PPO

## 2019-11-08 DIAGNOSIS — G8929 Other chronic pain: Secondary | ICD-10-CM

## 2019-11-08 DIAGNOSIS — M25511 Pain in right shoulder: Secondary | ICD-10-CM

## 2019-11-08 NOTE — Assessment & Plan Note (Signed)
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 She does not recall a particular injury but she does lift heavy patients at work. On exam she has a positive O'Brien's test, 2+ anterior translational instability, positive Hawkins test and Neer's test. I do think she has a labral tear and some subacromial bursitis. Switching to meloxicam, and tramadol for breakthrough pain, x-rays today, and adding an MR arthrogram, I will see her back for the arthrogram injection. Adding a work restrictions note as well.

## 2019-11-08 NOTE — Progress Notes (Signed)
    Procedures performed today:    None.  Independent interpretation of notes and tests performed by another provider:   None.  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 She does not recall a particular injury but she does lift heavy patients at work. On exam she has a positive O'Brien's test, 2+ anterior translational instability, positive Hawkins test and Neer's test. I do think she has a labral tear and some subacromial bursitis. Switching to meloxicam, and tramadol for breakthrough pain, x-rays today, and adding an MR arthrogram, I will see her back for the arthrogram injection. Adding a work restrictions note as well.    ___________________________________________ Ihor Austin. Benjamin Stain, M.D., ABFM., CAQSM. Primary Care and Sports Medicine [facility]  Adjunct Instructor of Family Medicine  [employer]

## 2019-11-09 MED ORDER — TRAMADOL HCL 50 MG PO TABS: 50.0000 mg | ORAL_TABLET | Freq: Three times a day (TID) | ORAL | 0 refills | Status: DC | PRN

## 2019-11-09 MED ORDER — MELOXICAM 15 MG PO TABS: ORAL_TABLET | ORAL | 3 refills | Status: DC

## 2019-11-14 ENCOUNTER — Telehealth: Payer: Self-pay

## 2019-11-14 MED ORDER — TRIAZOLAM 0.25 MG PO TABS
ORAL_TABLET | ORAL | 0 refills | Status: DC
Start: 2019-11-14 — End: 2019-11-20

## 2019-11-14 NOTE — Telephone Encounter (Signed)
No problem, sending in triazolam, she will most certainly need a driver.

## 2019-11-14 NOTE — Telephone Encounter (Signed)
Patient requesting pre-med prior to MRI Arthrogram which is scheduled for 11/20/19

## 2019-11-15 NOTE — Telephone Encounter (Signed)
Patient advised. Aware to have driver.

## 2019-11-20 ENCOUNTER — Ambulatory Visit (INDEPENDENT_AMBULATORY_CARE_PROVIDER_SITE_OTHER): Payer: BC Managed Care – PPO | Admitting: Sports Medicine

## 2019-11-20 ENCOUNTER — Ambulatory Visit (INDEPENDENT_AMBULATORY_CARE_PROVIDER_SITE_OTHER): Payer: BC Managed Care – PPO

## 2019-11-20 DIAGNOSIS — M25511 Pain in right shoulder: Secondary | ICD-10-CM

## 2019-11-20 DIAGNOSIS — G8929 Other chronic pain: Secondary | ICD-10-CM

## 2019-11-20 DIAGNOSIS — S43431A Superior glenoid labrum lesion of right shoulder, initial encounter: Secondary | ICD-10-CM | POA: Diagnosis not present

## 2019-11-20 NOTE — Assessment & Plan Note (Addendum)
Injection for MR arthrogram, see prior note for further details.  large SLAP tear in her labrum, this will likely need operative repair, I am going to go ahead and place the referral for shoulder surgery if this is okay with her.

## 2019-11-20 NOTE — Progress Notes (Addendum)
    Procedures performed today:    Procedure: Real-time Ultrasound Guided gadolinium contrast injection of right glenohumeral joint Device: Samsung HS60  Verbal informed consent obtained.  Time-out conducted.  Noted no overlying erythema, induration, or other signs of local infection.  Skin prepped in a sterile fashion.  Local anesthesia: Topical Ethyl chloride.  With sterile technique and under real time ultrasound guidance: Using a 22-gauge spinal needle advanced into the glenohumeral joint, 1 cc Kenalog 40, 2 cc lidocaine, 2 cc bupivacaine injected, syringe switched and 0.1 cc gadolinium injected, syringe again switched and 10 cc sterile saline used to distend the joint. Joint visualized and capsule seen distending confirming intra-articular placement of contrast material and medication. Completed without difficulty  Advised to call if fevers/chills, erythema, induration, drainage, or persistent bleeding.  Images permanently stored and available for review in the ultrasound unit.  Impression: Technically successful ultrasound guided gadolinium contrast injection for MR arthrography.  Please see separate MR arthrogram report.   Independent interpretation of notes and tests performed by another provider:   None.  Brief History, Exam, Impression, and Recommendations:    Chronic right shoulder pain Injection for MR arthrogram, see prior note for further details.  large SLAP tear in her labrum, this will likely need operative repair, I am going to go ahead and place the referral for shoulder surgery if this is okay with her.    ___________________________________________ Carla Griffin. Carla Griffin, M.D., ABFM., CAQSM. Primary Care and Sports Medicine [facility]  Adjunct Instructor of Family Medicine  [employer]

## 2019-11-21 NOTE — Addendum Note (Signed)
Addended by: Monica Becton on: 11/21/2019 10:11 AM   Modules accepted: Orders

## 2019-11-24 ENCOUNTER — Telehealth: Payer: Self-pay | Admitting: *Deleted

## 2019-11-24 DIAGNOSIS — M25511 Pain in right shoulder: Secondary | ICD-10-CM | POA: Diagnosis not present

## 2019-11-24 NOTE — Telephone Encounter (Signed)
° °  [facility] Pre-operative Risk Assessment    HEARTCARE STAFF: - Please ensure there is not already an duplicate clearance open for this procedure. - Under Visit Info/Reason for Call, type in Other and utilize the format Clearance MM/DD/YY or Clearance TBD. Do not use dashes or single digits. - If request is for dental extraction, please clarify the # of teeth to be extracted.  Request for surgical clearance:  1. What type of surgery is being performed? RIGHT SHOULDER SCOPE ROTATOR CUFF REPAIR   2. When is this surgery scheduled? 12/21/19   3. What type of clearance is required (medical clearance vs. Pharmacy clearance to hold med vs. Both)? MEDICAL  4. Are there any medications that need to be held prior to surgery and how long? NONE LISTED   5. Practice name and name of physician performing surgery? [facility]; DR. DAX VARKEY   6. What is the office phone number? [phone]   7.   What is the office fax number? [place].   Anesthesia type (None, local, MAC, general) ? CHOICE   Julaine Hua 11/24/2019, 3:57 PM  _________________________________________________________________   (provider comments below)

## 2019-11-24 NOTE — Telephone Encounter (Signed)
   Primary Cardiologist: Will [name], MD  Chart reviewed as part of pre-operative protocol coverage. Given past medical history and time since last visit, based on ACC/AHA guidelines, Carla Griffin would be at acceptable risk for the planned procedure without further cardiovascular testing.   I will route this recommendation to the requesting party via Epic fax function and remove from pre-op pool.  Please call with questions.  Thomasene Ripple. Misa Fedorko NP-C    11/24/2019, 4:08 PM [facility] [address] Office [phone] Fax [phone]

## 2019-12-08 ENCOUNTER — Ambulatory Visit (INDEPENDENT_AMBULATORY_CARE_PROVIDER_SITE_OTHER): Payer: BLUE CROSS/BLUE SHIELD

## 2019-12-08 ENCOUNTER — Other Ambulatory Visit: Payer: Self-pay

## 2019-12-08 VITALS — BP 125/74 | HR 83 | Ht 66.0 in | Wt 152.0 lb

## 2019-12-08 DIAGNOSIS — Z3041 Encounter for surveillance of contraceptive pills: Secondary | ICD-10-CM

## 2019-12-08 DIAGNOSIS — Z01419 Encounter for gynecological examination (general) (routine) without abnormal findings: Secondary | ICD-10-CM | POA: Diagnosis not present

## 2019-12-08 DIAGNOSIS — N6310 Unspecified lump in the right breast, unspecified quadrant: Secondary | ICD-10-CM

## 2019-12-08 DIAGNOSIS — N951 Menopausal and female climacteric states: Secondary | ICD-10-CM

## 2019-12-08 MED ORDER — NORETHIN ACE-ETH ESTRAD-FE 1-20 MG-MCG PO TABS: 1.0000 | ORAL_TABLET | Freq: Every day | ORAL | 11 refills | Status: AC

## 2019-12-08 NOTE — Progress Notes (Addendum)
GYNECOLOGY ANNUAL PREVENTATIVE CARE ENCOUNTER NOTE  History:     Carla Griffin is a 39 y.o. G56P2002 female here for a routine annual gynecologic exam.  Current complaints: new lump on right breast, some irregular periods.   Denies abnormal vaginal bleeding, discharge, pelvic pain, problems with intercourse or other gynecologic concerns.    Gynecologic History Patient's last menstrual period was 11/13/2019. Contraception: OCP (estrogen/progesterone) Last Pap: 2019. Results were: normal with negative HPV Last mammogram: n/a.  Obstetric History OB History  Gravida Para Term Preterm AB Living  2 2 2     2   SAB TAB Ectopic Multiple Live Births               # Outcome Date GA Lbr Len/2nd Weight Sex Delivery Anes PTL Lv  2 Term           1 Term             Past Medical History:  Diagnosis Date  . Gallstones   . GERD (gastroesophageal reflux disease)   . IBS (irritable bowel syndrome)   . Pneumonia   . POTS (postural orthostatic tachycardia syndrome)   . Urinary tract infection     Past Surgical History:  Procedure Laterality Date  . BRAVO PH STUDY N/A 03/03/2016   Procedure: BRAVO PH STUDY;  Surgeon: 05/03/2016, MD;  Location: [facility];  Service: Endoscopy;  Laterality: N/A;  . CHOLECYSTECTOMY  2014  . ESOPHAGOGASTRODUODENOSCOPY (EGD) WITH PROPOFOL N/A 03/03/2016   Procedure: ESOPHAGOGASTRODUODENOSCOPY (EGD) WITH PROPOFOL;  Surgeon: 05/03/2016, MD;  Location: [facility];  Service: Endoscopy;  Laterality: N/A;  . WISDOM TOOTH EXTRACTION      Current Outpatient Medications on File Prior to Visit  Medication Sig Dispense Refill  . acetaminophen (TYLENOL) 500 MG tablet Take 500 mg by mouth daily as needed for mild pain or headache.     . diltiazem (CARDIZEM CD) 240 MG 24 hr capsule TAKE 1 CAPSULE BY MOUTH EVERY DAY 90 capsule 3  . esomeprazole (NEXIUM) 40 MG capsule Take 1 capsule (40 mg total) by mouth 2 (two) times daily before a meal. 180 capsule 3    . ibuprofen (ADVIL,MOTRIN) 200 MG tablet Take 1-4 tablets (200-800 mg total) by mouth every 6 (six) hours as needed for headache or mild pain. 30 tablet 0  . meloxicam (MOBIC) 15 MG tablet One tab PO qAM with a meal for 2 weeks, then daily prn pain. 30 tablet 3  . Multiple Vitamin (MULTIVITAMIN) tablet Take 1 tablet by mouth daily.    . Probiotic Product (ALIGN) 4 MG CAPS Take 4 mg by mouth daily before breakfast.     . traMADol (ULTRAM) 50 MG tablet Take 1-2 tablets (50-100 mg total) by mouth every 8 (eight) hours as needed for moderate pain. Maximum 6 tabs per day. 21 tablet 0   No current facility-administered medications on file prior to visit.    No Known Allergies  Social History:  reports that she has never smoked. She has never used smokeless tobacco. She reports current alcohol use. She reports that she does not use drugs.  Family History  Problem Relation Age of Onset  . Lupus Mother   . Hyperlipidemia Father   . Hypertension Father   . Diabetes Maternal Grandfather   . Heart attack Maternal Grandfather   . Stroke Paternal Grandfather     The following portions of the patient's history were reviewed and updated as appropriate: allergies, current medications,  past family history, past medical history, past social history, past surgical history and problem list.  Review of Systems Pertinent items noted in HPI and remainder of comprehensive ROS otherwise negative.  Physical Exam:  BP 125/74   Pulse 83   Ht 5\' 6"  (1.676 m)   Wt 152 lb (68.9 kg)   LMP 11/13/2019   BMI 24.53 kg/m  CONSTITUTIONAL: Well-developed, well-nourished female in no acute distress.  HENT:  Normocephalic, atraumatic, External right and left ear normal. Oropharynx is clear and moist EYES: Conjunctivae and EOM are normal. Pupils are equal, round, and reactive to light. No scleral icterus.  NECK: Normal range of motion, supple, no masses.  Normal thyroid.  SKIN: Skin is warm and dry. No rash noted.  Not diaphoretic. No erythema. No pallor. MUSCULOSKELETAL: Normal range of motion. No tenderness.  No cyanosis, clubbing, or edema.  2+ distal pulses. NEUROLOGIC: Alert and oriented to person, place, and time. Normal reflexes, muscle tone coordination.  PSYCHIATRIC: Normal mood and affect. Normal behavior. Normal judgment and thought content. CARDIOVASCULAR: Normal heart rate noted, regular rhythm RESPIRATORY: Clear to auscultation bilaterally. Effort and breath sounds normal, no problems with respiration noted. BREASTS: Symmetric in size. Fibrous tissue felt near right nipple. No tenderness, skin changes, nipple drainage, or lymphadenopathy bilaterally. Performed in the presence of a chaperone. ABDOMEN: Soft, no distention noted.  No tenderness, rebound or guarding.  PELVIC: Normal appearing external genitalia and urethral meatus; normal appearing vaginal mucosa and cervix.  No abnormal discharge noted.  Pap smear obtained.  Normal uterine size, no other palpable masses, no uterine or adnexal tenderness.  Performed in the presence of a chaperone.   Assessment and Plan:    1. Encounter for surveillance of contraceptive pills - norethindrone-ethinyl estradiol (JUNEL FE 1/20) 1-20 MG-MCG tablet; Take 1 tablet by mouth daily.  Dispense: 30 tablet; Refill: 11  2. Perimenopausal vasomotor symptoms -Intermittent changes in her period despite OCPs and intermittent hot flashes. Discussed changes OCPs but patient states the changes are tolerable for now. Will let office know if she needs different RX  3. Lump of right breast -Patient reports changes in breast tissue in right breast. Reports family hx of breast changes at this age. Will send for u/s and follow up as needed - 2/20 BREAST LTD UNI RIGHT INC AXILLA; Future  4. Encounter for well woman exam with routine gynecological exam -Normal pap in 2019. Reviewed screening guidelines at length and will follow up as recommended.  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 Please refer to After Visit Summary for other counseling recommendations.     February, CNM 12/08/19 11:13 AM

## 2019-12-13 ENCOUNTER — Encounter (HOSPITAL_BASED_OUTPATIENT_CLINIC_OR_DEPARTMENT_OTHER): Payer: Self-pay | Admitting: Orthopaedic Surgery

## 2019-12-13 ENCOUNTER — Other Ambulatory Visit: Payer: Self-pay

## 2019-12-18 ENCOUNTER — Other Ambulatory Visit (HOSPITAL_COMMUNITY)
Admission: RE | Admit: 2019-12-18 | Discharge: 2019-12-18 | Disposition: A | Discharge status: Home / Self Care | Payer: BLUE CROSS/BLUE SHIELD | Source: Ambulatory Visit | Attending: Orthopaedic Surgery | Admitting: Orthopaedic Surgery

## 2019-12-18 ENCOUNTER — Other Ambulatory Visit: Payer: BLUE CROSS/BLUE SHIELD

## 2019-12-18 DIAGNOSIS — Z20822 Contact with and (suspected) exposure to covid-19: Secondary | ICD-10-CM | POA: Diagnosis not present

## 2019-12-18 DIAGNOSIS — Z01812 Encounter for preprocedural laboratory examination: Secondary | ICD-10-CM | POA: Diagnosis not present

## 2019-12-18 LAB — SARS CORONAVIRUS 2 (TAT 6-24 HRS): SARS Coronavirus 2: NEGATIVE

## 2019-12-18 NOTE — Progress Notes (Signed)
      Enhanced Recovery after Surgery for Orthopedics Enhanced Recovery after Surgery is a protocol used to improve the stress on your body and your recovery after surgery.  Patient Instructions  . The night before surgery:  o No food after midnight. ONLY clear liquids after midnight  . The day of surgery (if you do NOT have diabetes):  o Drink ONE (1) Pre-Surgery Clear Ensure by 0545.  o This drink was given to you during your hospital  pre-op appointment visit. o The pre-op nurse will instruct you on the time to drink the  Pre-Surgery Ensure depending on your surgery time. o Finish the drink at the designated time by the pre-op nurse.  o Nothing else to drink after completing the  Pre-Surgery Clear Ensure.  . The day of surgery (if you have diabetes): o Drink ONE (1) Gatorade 2 (G2) as directed. o This drink was given to you during your hospital  pre-op appointment visit.  o The pre-op nurse will instruct you on the time to drink the   Gatorade 2 (G2) depending on your surgery time. o Color of the Gatorade may vary. Red is not allowed. o Nothing else to drink after completing the  Gatorade 2 (G2).         If you have questions, please contact your surgeon's office.

## 2019-12-19 ENCOUNTER — Other Ambulatory Visit: Payer: Self-pay

## 2019-12-19 ENCOUNTER — Ambulatory Visit
Admission: RE | Admit: 2019-12-19 | Discharge: 2019-12-19 | Disposition: A | Discharge status: Home / Self Care | Payer: BLUE CROSS/BLUE SHIELD | Source: Ambulatory Visit

## 2019-12-19 DIAGNOSIS — N6489 Other specified disorders of breast: Secondary | ICD-10-CM | POA: Diagnosis not present

## 2019-12-19 DIAGNOSIS — N6310 Unspecified lump in the right breast, unspecified quadrant: Secondary | ICD-10-CM

## 2019-12-19 DIAGNOSIS — R922 Inconclusive mammogram: Secondary | ICD-10-CM | POA: Diagnosis not present

## 2019-12-20 NOTE — H&P (Signed)
PREOPERATIVE H&P  Chief Complaint: RIGHT SHOULDER SLAP TEAR  HPI: Carla Griffin is a 39 y.o. female who is scheduled SHOULDER ARTHROSCOPY WITH SUBACROMIAL DECOMPRESSION AND DISTAL CLAVICLE EXCISION.   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 She has had pain in her right shoulder starting in January. She has tried injections and therapy. She has a catching, popping and giving away in her shoulder. She has a history of positional orthostatic hypotension but otherwise is healthy. She is right hand dominant at baseline. She is worried about her ability to get back to work with the shoulder. She does active work outs and other things. She has never had a reported history of instability of the shoulder.   Her symptoms are rated as moderate to severe, and have been worsening.  This is significantly impairing activities of daily living.    Please see clinic note for further details on this patient's care.    She has elected for surgical management.   Past Medical History:  Diagnosis Date  . Gallstones   . GERD (gastroesophageal reflux disease)   . IBS (irritable bowel syndrome)   . Pneumonia   . POTS (postural orthostatic tachycardia syndrome)   . Urinary tract infection    Past Surgical History:  Procedure Laterality Date  . BRAVO PH STUDY N/A 03/03/2016   Procedure: BRAVO PH STUDY;  Surgeon: Napoleon Form, MD;  Location: [facility];  Service: Endoscopy;  Laterality: N/A;  . CHOLECYSTECTOMY  2014  . ESOPHAGOGASTRODUODENOSCOPY (EGD) WITH PROPOFOL N/A 03/03/2016   Procedure: ESOPHAGOGASTRODUODENOSCOPY (EGD) WITH PROPOFOL;  Surgeon: Napoleon Form, MD;  Location: [facility];  Service: Endoscopy;  Laterality: N/A;  . WISDOM TOOTH EXTRACTION     Social History   Socioeconomic History  . Marital status: Married    Spouse name: Not on file  . Number of children: 2  .  Years of education: Not on file  . Highest education level: Not on file  Occupational History  . Occupation: Engineer, civil (consulting) at [employer]    Comment: Ortho Floor  Tobacco Use  . Smoking status: Never Smoker  . Smokeless tobacco: Never Used  Vaping Use  . Vaping Use: Never used  Substance and Sexual Activity  . Alcohol use: Yes    Alcohol/week: 0.0 standard drinks    Comment: occasional  . Drug use: No  . Sexual activity: Yes    Birth control/protection: Pill  Other Topics Concern  . Not on file  Social History Narrative   Lives with husband in [place].   Active in her church.   Regular exercise:  Yes   RN at [employer]   Social Determinants of Health   Financial Resource Strain:   . Difficulty of Paying Living Expenses:   Food Insecurity:   . Worried About Programme researcher, broadcasting/film/video in the Last Year:   . Barista in the Last Year:   Transportation Needs:   . Freight forwarder (Medical):   Marland Kitchen Lack of Transportation (Non-Medical):   Physical Activity:   . Days of Exercise per Week:   . Minutes of Exercise per Session:   Stress:   . Feeling of Stress :   Social Connections:   . Frequency of Communication with Friends and Family:   . Frequency of Social Gatherings with Friends and Family:   . Attends Religious Services:   .  Active Member of Clubs or Organizations:   . Attends Banker Meetings:   Marland Kitchen Marital Status:    Family History  Problem Relation Age of Onset  . Lupus Mother   . Hyperlipidemia Father   . Hypertension Father   . Diabetes Maternal Grandfather   . Heart attack Maternal Grandfather   . Stroke Paternal Grandfather    No Known Allergies Prior to Admission medications   Medication Sig Start Date End Date Taking? Authorizing Provider  acetaminophen (TYLENOL) 500 MG tablet Take 500 mg by mouth daily as needed for mild pain or headache.    Yes [provider]  diltiazem (CARDIZEM CD) 240 MG 24 hr capsule TAKE 1 CAPSULE BY MOUTH EVERY DAY 06/29/19   Yes Camnitz, Will Daphine Deutscher, MD  esomeprazole (NEXIUM) 40 MG capsule Take 1 capsule (40 mg total) by mouth 2 (two) times daily before a meal. 02/19/17  Yes Nandigam, Eleonore Chiquito, MD  ibuprofen (ADVIL,MOTRIN) 200 MG tablet Take 1-4 tablets (200-800 mg total) by mouth every 6 (six) hours as needed for headache or mild pain. 08/12/17  Yes [name], DO  Multiple Vitamin (MULTIVITAMIN) tablet Take 1 tablet by mouth daily.   Yes [provider]  norethindrone-ethinyl estradiol (JUNEL FE 1/20) 1-20 MG-MCG tablet Take 1 tablet by mouth daily. 12/08/19  Yes Rolm Bookbinder, CNM  Probiotic Product (ALIGN) 4 MG CAPS Take 4 mg by mouth daily before breakfast.    Yes [provider]  meloxicam (MOBIC) 15 MG tablet One tab PO qAM with a meal for 2 weeks, then daily prn pain. 11/09/19   Monica Becton, MD  traMADol (ULTRAM) 50 MG tablet Take 1-2 tablets (50-100 mg total) by mouth every 8 (eight) hours as needed for moderate pain. Maximum 6 tabs per day. 11/09/19   Monica Becton, MD    ROS: All other systems have been reviewed and were otherwise negative with the exception of those mentioned in the HPI and as above.  Physical Exam: General: Alert, no acute distress Cardiovascular: No pedal edema Respiratory: No cyanosis, no use of accessory musculature GI: No organomegaly, abdomen is soft and non-tender Skin: No lesions in the area of chief complaint Neurologic: Sensation intact distally Psychiatric: Patient is competent for consent with normal mood and affect Lymphatic: No axillary or cervical lymphadenopathy  MUSCULOSKELETAL:  Examination of the right shoulder reveals active forward elevation to 170, external rotation to 60 and symmetric. Cuff strength is 5/5. She has a positive O'Brien's and a positive AC tenderness to palpation. Positive impingement. She has  equivocal anterior apprehension signs.  She has a negative posterior load and shift.     Imaging: Right  shoulder MRI reviewed and demonstrates a SLAP tear. No obvious anterior/inferior labral pathology.  Type II acromion. Mild AC arthrosis and some subacromial bursitis.   Assessment: Right shoulder SLAP tear, impingement, AC arthrosis and the possibility of some mild anterior instability.   Plan: Plan for Procedure(s): SHOULDER ARTHROSCOPY WITH SUBACROMIAL DECOMPRESSION AND DISTAL CLAVICLE EXCISION  Dr. Everardo Pacific and the patient talked about the risks, benefits and alternatives of an arthroscopic subacromial decompression, distal clavicle excision, biceps tenodesis in the beach chair position  with a possible anterior labral repair if necessary  The risks benefits and alternatives were discussed with the patient including but not limited to the risks of nonoperative treatment, versus surgical intervention including infection, bleeding, nerve injury,  blood clots, cardiopulmonary complications, morbidity, mortality, among others, and they were willing to proceed.  The patient acknowledged the explanation, agreed to proceed with the plan and consent was signed.   Operative Plan: Right shoulder scope with SAD, DCE, BT, possible anterior labral repair Discharge Medications: Tylenol, Mobic 15, Oxycodone, Zofran DVT Prophylaxis: None Physical Therapy: Outpatient PT Special Discharge needs: Sling   Vernetta Honey, PA-C  12/20/2019 4:05 PM

## 2019-12-21 ENCOUNTER — Encounter (HOSPITAL_BASED_OUTPATIENT_CLINIC_OR_DEPARTMENT_OTHER)
Admission: RE | Disposition: A | Discharge status: Home / Self Care | Payer: Self-pay | Source: Home / Self Care | Attending: Orthopaedic Surgery

## 2019-12-21 ENCOUNTER — Ambulatory Visit (HOSPITAL_BASED_OUTPATIENT_CLINIC_OR_DEPARTMENT_OTHER): Payer: BLUE CROSS/BLUE SHIELD | Admitting: Anesthesiology

## 2019-12-21 ENCOUNTER — Encounter (HOSPITAL_BASED_OUTPATIENT_CLINIC_OR_DEPARTMENT_OTHER): Payer: Self-pay | Admitting: Orthopaedic Surgery

## 2019-12-21 ENCOUNTER — Other Ambulatory Visit: Payer: Self-pay

## 2019-12-21 ENCOUNTER — Ambulatory Visit (HOSPITAL_BASED_OUTPATIENT_CLINIC_OR_DEPARTMENT_OTHER)
Admission: RE | Admit: 2019-12-21 | Discharge: 2019-12-21 | Disposition: A | Discharge status: Home / Self Care | Payer: BLUE CROSS/BLUE SHIELD | Attending: Orthopaedic Surgery | Admitting: Orthopaedic Surgery

## 2019-12-21 DIAGNOSIS — M7541 Impingement syndrome of right shoulder: Secondary | ICD-10-CM | POA: Insufficient documentation

## 2019-12-21 DIAGNOSIS — Z79899 Other long term (current) drug therapy: Secondary | ICD-10-CM | POA: Diagnosis not present

## 2019-12-21 DIAGNOSIS — K219 Gastro-esophageal reflux disease without esophagitis: Secondary | ICD-10-CM | POA: Insufficient documentation

## 2019-12-21 DIAGNOSIS — X58XXXA Exposure to other specified factors, initial encounter: Secondary | ICD-10-CM | POA: Diagnosis not present

## 2019-12-21 DIAGNOSIS — M7551 Bursitis of right shoulder: Secondary | ICD-10-CM | POA: Diagnosis not present

## 2019-12-21 DIAGNOSIS — M24011 Loose body in right shoulder: Secondary | ICD-10-CM | POA: Diagnosis not present

## 2019-12-21 DIAGNOSIS — G8918 Other acute postprocedural pain: Secondary | ICD-10-CM | POA: Diagnosis not present

## 2019-12-21 DIAGNOSIS — M24111 Other articular cartilage disorders, right shoulder: Secondary | ICD-10-CM | POA: Diagnosis not present

## 2019-12-21 DIAGNOSIS — S43431A Superior glenoid labrum lesion of right shoulder, initial encounter: Secondary | ICD-10-CM | POA: Diagnosis not present

## 2019-12-21 DIAGNOSIS — M25311 Other instability, right shoulder: Secondary | ICD-10-CM | POA: Insufficient documentation

## 2019-12-21 DIAGNOSIS — Z793 Long term (current) use of hormonal contraceptives: Secondary | ICD-10-CM | POA: Insufficient documentation

## 2019-12-21 DIAGNOSIS — M19011 Primary osteoarthritis, right shoulder: Secondary | ICD-10-CM | POA: Diagnosis not present

## 2019-12-21 DIAGNOSIS — E559 Vitamin D deficiency, unspecified: Secondary | ICD-10-CM | POA: Diagnosis not present

## 2019-12-21 DIAGNOSIS — M7521 Bicipital tendinitis, right shoulder: Secondary | ICD-10-CM | POA: Diagnosis not present

## 2019-12-21 HISTORY — DX: Nausea with vomiting, unspecified: R11.2

## 2019-12-21 HISTORY — DX: Other specified postprocedural states: Z98.890

## 2019-12-21 HISTORY — PX: BICEPT TENODESIS: SHX5116

## 2019-12-21 LAB — POCT PREGNANCY, URINE: Preg Test, Ur: NEGATIVE

## 2019-12-21 SURGERY — SHOULDER ARTHROSCOPY WITH SUBACROMIAL DECOMPRESSION AND DISTAL CLAVICLE EXCISION
Anesthesia: General | Site: Shoulder | Laterality: Right

## 2019-12-21 MED ORDER — SUGAMMADEX SODIUM 500 MG/5ML IV SOLN
INTRAVENOUS | Status: AC
  Filled 2019-12-21: qty 5

## 2019-12-21 MED ORDER — ONDANSETRON HCL 4 MG PO TABS: 4.0000 mg | ORAL_TABLET | Freq: Three times a day (TID) | ORAL | 1 refills | Status: AC | PRN

## 2019-12-21 MED ORDER — DEXAMETHASONE SODIUM PHOSPHATE 4 MG/ML IJ SOLN
INTRAMUSCULAR | Status: DC | PRN
  Administered 2019-12-21: 5 mg via INTRAVENOUS

## 2019-12-21 MED ORDER — DIPHENHYDRAMINE HCL 50 MG/ML IJ SOLN
INTRAMUSCULAR | Status: DC | PRN
Start: 2019-12-21 — End: 2019-12-21
  Administered 2019-12-21: 6.25 mg via INTRAVENOUS

## 2019-12-21 MED ORDER — FENTANYL CITRATE (PF) 100 MCG/2ML IJ SOLN
INTRAMUSCULAR | Status: AC
  Filled 2019-12-21: qty 2

## 2019-12-21 MED ORDER — ACETAMINOPHEN 10 MG/ML IV SOLN: 1000.0000 mg | Freq: Once | INTRAVENOUS | Status: DC | PRN

## 2019-12-21 MED ORDER — EPHEDRINE SULFATE 50 MG/ML IJ SOLN
INTRAMUSCULAR | Status: DC | PRN
  Administered 2019-12-21: 10 mg via INTRAVENOUS

## 2019-12-21 MED ORDER — DIPHENHYDRAMINE HCL 50 MG/ML IJ SOLN
INTRAMUSCULAR | Status: AC
  Filled 2019-12-21: qty 1

## 2019-12-21 MED ORDER — ACETAMINOPHEN 160 MG/5ML PO SOLN: 1000.0000 mg | Freq: Once | ORAL | Status: DC | PRN

## 2019-12-21 MED ORDER — LACTATED RINGERS IV SOLN: INTRAVENOUS | Status: DC

## 2019-12-21 MED ORDER — EPHEDRINE 5 MG/ML INJ
INTRAVENOUS | Status: AC
  Filled 2019-12-21: qty 10

## 2019-12-21 MED ORDER — ACETAMINOPHEN 500 MG PO TABS: 1000.0000 mg | ORAL_TABLET | Freq: Once | ORAL | Status: DC | PRN

## 2019-12-21 MED ORDER — OXYCODONE HCL 5 MG PO TABS: 5.0000 mg | ORAL_TABLET | Freq: Once | ORAL | Status: DC | PRN

## 2019-12-21 MED ORDER — SCOPOLAMINE 1 MG/3DAYS TD PT72
1.0000 | MEDICATED_PATCH | TRANSDERMAL | Status: DC
  Administered 2019-12-21: 1.5 mg via TRANSDERMAL

## 2019-12-21 MED ORDER — FENTANYL CITRATE (PF) 100 MCG/2ML IJ SOLN: 25.0000 ug | INTRAMUSCULAR | Status: DC | PRN

## 2019-12-21 MED ORDER — MIDAZOLAM HCL 2 MG/2ML IJ SOLN
INTRAMUSCULAR | Status: AC
  Filled 2019-12-21: qty 2

## 2019-12-21 MED ORDER — PROPOFOL 10 MG/ML IV BOLUS
INTRAVENOUS | Status: DC | PRN
  Administered 2019-12-21: 150 mg via INTRAVENOUS

## 2019-12-21 MED ORDER — SUCCINYLCHOLINE CHLORIDE 200 MG/10ML IV SOSY
PREFILLED_SYRINGE | INTRAVENOUS | Status: AC
  Filled 2019-12-21: qty 10

## 2019-12-21 MED ORDER — ACETAMINOPHEN 500 MG PO TABS
1000.0000 mg | ORAL_TABLET | Freq: Once | ORAL | Status: AC
  Administered 2019-12-21: 1000 mg via ORAL

## 2019-12-21 MED ORDER — MIDAZOLAM HCL 2 MG/2ML IJ SOLN
2.0000 mg | Freq: Once | INTRAMUSCULAR | Status: AC
  Administered 2019-12-21: 2 mg via INTRAVENOUS

## 2019-12-21 MED ORDER — LIDOCAINE HCL (CARDIAC) PF 100 MG/5ML IV SOSY
PREFILLED_SYRINGE | INTRAVENOUS | Status: DC | PRN
  Administered 2019-12-21: 60 mg via INTRAVENOUS

## 2019-12-21 MED ORDER — PROMETHAZINE HCL 25 MG/ML IJ SOLN
INTRAMUSCULAR | Status: AC
  Filled 2019-12-21: qty 1

## 2019-12-21 MED ORDER — OXYCODONE HCL 5 MG/5ML PO SOLN: 5.0000 mg | Freq: Once | ORAL | Status: DC | PRN

## 2019-12-21 MED ORDER — DEXAMETHASONE SODIUM PHOSPHATE 10 MG/ML IJ SOLN
INTRAMUSCULAR | Status: AC
  Filled 2019-12-21: qty 1

## 2019-12-21 MED ORDER — SCOPOLAMINE 1 MG/3DAYS TD PT72
MEDICATED_PATCH | TRANSDERMAL | Status: AC
  Filled 2019-12-21: qty 1

## 2019-12-21 MED ORDER — PROMETHAZINE HCL 25 MG/ML IJ SOLN
6.2500 mg | Freq: Once | INTRAMUSCULAR | Status: AC
  Administered 2019-12-21: 6.25 mg via INTRAVENOUS

## 2019-12-21 MED ORDER — ONDANSETRON HCL 4 MG/2ML IJ SOLN
INTRAMUSCULAR | Status: DC | PRN
  Administered 2019-12-21: 4 mg via INTRAVENOUS

## 2019-12-21 MED ORDER — ROCURONIUM BROMIDE 100 MG/10ML IV SOLN
INTRAVENOUS | Status: DC | PRN
  Administered 2019-12-21: 50 mg via INTRAVENOUS

## 2019-12-21 MED ORDER — MELOXICAM 15 MG PO TABS: 15.0000 mg | ORAL_TABLET | Freq: Every day | ORAL | 0 refills | Status: DC

## 2019-12-21 MED ORDER — CEFAZOLIN SODIUM-DEXTROSE 2-4 GM/100ML-% IV SOLN
2.0000 g | INTRAVENOUS | Status: AC
  Administered 2019-12-21: 2 g via INTRAVENOUS

## 2019-12-21 MED ORDER — BUPIVACAINE-EPINEPHRINE (PF) 0.5% -1:200000 IJ SOLN
INTRAMUSCULAR | Status: DC | PRN
  Administered 2019-12-21: 15 mL via PERINEURAL

## 2019-12-21 MED ORDER — LIDOCAINE 2% (20 MG/ML) 5 ML SYRINGE
INTRAMUSCULAR | Status: AC
  Filled 2019-12-21: qty 5

## 2019-12-21 MED ORDER — FENTANYL CITRATE (PF) 100 MCG/2ML IJ SOLN
INTRAMUSCULAR | Status: DC | PRN
  Administered 2019-12-21: 50 ug via INTRAVENOUS

## 2019-12-21 MED ORDER — SODIUM CHLORIDE 0.9 % IR SOLN
Status: DC | PRN
  Administered 2019-12-21: 6000 mL

## 2019-12-21 MED ORDER — OXYCODONE HCL 5 MG PO TABS: ORAL_TABLET | ORAL | 0 refills | Status: AC

## 2019-12-21 MED ORDER — CEFAZOLIN SODIUM-DEXTROSE 2-4 GM/100ML-% IV SOLN
INTRAVENOUS | Status: AC
  Filled 2019-12-21: qty 100

## 2019-12-21 MED ORDER — ACETAMINOPHEN 500 MG PO TABS
1000.0000 mg | ORAL_TABLET | Freq: Three times a day (TID) | ORAL | 0 refills | Status: AC
Start: 2019-12-21 — End: 2020-01-04

## 2019-12-21 MED ORDER — FENTANYL CITRATE (PF) 100 MCG/2ML IJ SOLN
25.0000 ug | INTRAMUSCULAR | Status: DC | PRN
  Administered 2019-12-21 (×2): 50 ug via INTRAVENOUS

## 2019-12-21 MED ORDER — FENTANYL CITRATE (PF) 100 MCG/2ML IJ SOLN
100.0000 ug | Freq: Once | INTRAMUSCULAR | Status: AC
  Administered 2019-12-21: 50 ug via INTRAVENOUS

## 2019-12-21 MED ORDER — BUPIVACAINE LIPOSOME 1.3 % IJ SUSP
INTRAMUSCULAR | Status: DC | PRN
  Administered 2019-12-21: 10 mL via PERINEURAL

## 2019-12-21 MED ORDER — SUGAMMADEX SODIUM 200 MG/2ML IV SOLN
INTRAVENOUS | Status: DC | PRN
  Administered 2019-12-21: 200 mg via INTRAVENOUS

## 2019-12-21 MED ORDER — PHENYLEPHRINE 40 MCG/ML (10ML) SYRINGE FOR IV PUSH (FOR BLOOD PRESSURE SUPPORT)
PREFILLED_SYRINGE | INTRAVENOUS | Status: AC
  Filled 2019-12-21: qty 10

## 2019-12-21 MED ORDER — ROCURONIUM BROMIDE 10 MG/ML (PF) SYRINGE
PREFILLED_SYRINGE | INTRAVENOUS | Status: AC
  Filled 2019-12-21: qty 10

## 2019-12-21 MED ORDER — ONDANSETRON HCL 4 MG/2ML IJ SOLN
INTRAMUSCULAR | Status: AC
  Filled 2019-12-21: qty 2

## 2019-12-21 MED ORDER — ACETAMINOPHEN 500 MG PO TABS
ORAL_TABLET | ORAL | Status: AC
  Filled 2019-12-21: qty 2

## 2019-12-21 SURGICAL SUPPLY — 58 items
AID PSTN UNV HD RSTRNT DISP (MISCELLANEOUS) ×1
ANCHOR SUT 1.8 FBRTK KNTLS 2SU (Anchor) ×4 IMPLANT
APL PRP STRL LF DISP 70% ISPRP (MISCELLANEOUS) ×1
BLADE EXCALIBUR 4.0X13 (MISCELLANEOUS) ×2 IMPLANT
BLADE SURG 10 STRL SS (BLADE) IMPLANT
BURR OVAL 8 FLU 4.0X13 (MISCELLANEOUS) IMPLANT
CANNULA 5.75X71 LONG (CANNULA) IMPLANT
CANNULA PASSPORT 5 (CANNULA) IMPLANT
CANNULA PASSPORT BUTTON 10-40 (CANNULA) IMPLANT
CANNULA TWIST IN 8.25X7CM (CANNULA) IMPLANT
CHLORAPREP W/TINT 26 (MISCELLANEOUS) ×2 IMPLANT
CLSR STERI-STRIP ANTIMIC 1/2X4 (GAUZE/BANDAGES/DRESSINGS) ×2 IMPLANT
COVER WAND RF STERILE (DRAPES) IMPLANT
DECANTER SPIKE VIAL GLASS SM (MISCELLANEOUS) IMPLANT
DISSECTOR 3.5MM X 13CM CVD (MISCELLANEOUS) IMPLANT
DISSECTOR 4.0MMX13CM CVD (MISCELLANEOUS) IMPLANT
DRAPE IMP U-DRAPE 54X76 (DRAPES) ×2 IMPLANT
DRAPE INCISE IOBAN 66X45 STRL (DRAPES) IMPLANT
DRAPE SHOULDER BEACH CHAIR (DRAPES) ×2 IMPLANT
DRSG PAD ABDOMINAL 8X10 ST (GAUZE/BANDAGES/DRESSINGS) ×2 IMPLANT
DW OUTFLOW CASSETTE/TUBE SET (MISCELLANEOUS) ×2 IMPLANT
GAUZE SPONGE 4X4 12PLY STRL (GAUZE/BANDAGES/DRESSINGS) ×2 IMPLANT
GLOVE BIO SURGEON STRL SZ 6.5 (GLOVE) ×2 IMPLANT
GLOVE BIOGEL PI IND STRL 6.5 (GLOVE) ×1 IMPLANT
GLOVE BIOGEL PI IND STRL 8 (GLOVE) ×1 IMPLANT
GLOVE BIOGEL PI INDICATOR 6.5 (GLOVE) ×1
GLOVE BIOGEL PI INDICATOR 8 (GLOVE) ×1
GLOVE ECLIPSE 8.0 STRL XLNG CF (GLOVE) ×4 IMPLANT
GOWN STRL REUS W/ TWL LRG LVL3 (GOWN DISPOSABLE) ×2 IMPLANT
GOWN STRL REUS W/TWL LRG LVL3 (GOWN DISPOSABLE) ×4
GOWN STRL REUS W/TWL XL LVL3 (GOWN DISPOSABLE) ×2 IMPLANT
KIT STABILIZATION SHOULDER (MISCELLANEOUS) ×2 IMPLANT
KIT STR SPEAR 1.8 FBRTK DISP (KITS) ×2 IMPLANT
LASSO CRESCENT QUICKPASS (SUTURE) ×2 IMPLANT
MANIFOLD NEPTUNE II (INSTRUMENTS) ×2 IMPLANT
NDL SAFETY ECLIPSE 18X1.5 (NEEDLE) ×1 IMPLANT
NEEDLE HYPO 18GX1.5 SHARP (NEEDLE) ×2
NEEDLE SCORPION MULTI FIRE (NEEDLE) IMPLANT
PACK ARTHROSCOPY DSU (CUSTOM PROCEDURE TRAY) ×2 IMPLANT
PACK BASIN DAY SURGERY FS (CUSTOM PROCEDURE TRAY) ×2 IMPLANT
PORT APPOLLO RF 90DEGREE MULTI (SURGICAL WAND) ×2 IMPLANT
RESTRAINT HEAD UNIVERSAL NS (MISCELLANEOUS) ×2 IMPLANT
SHEET MEDIUM DRAPE 40X70 STRL (DRAPES) IMPLANT
SLEEVE SCD COMPRESS KNEE MED (MISCELLANEOUS) ×2 IMPLANT
SLING ARM FOAM STRAP LRG (SOFTGOODS) IMPLANT
SUT FIBERWIRE #2 38 T-5 BLUE (SUTURE)
SUT MNCRL AB 4-0 PS2 18 (SUTURE) ×2 IMPLANT
SUT PDS AB 1 CT  36 (SUTURE)
SUT PDS AB 1 CT 36 (SUTURE) IMPLANT
SUT TIGER TAPE 7 IN WHITE (SUTURE) IMPLANT
SUTURE FIBERWR #2 38 T-5 BLUE (SUTURE) IMPLANT
SUTURE TAPE TIGERLINK 1.3MM BL (SUTURE) IMPLANT
SUTURETAPE TIGERLINK 1.3MM BL (SUTURE)
SYR 5ML LL (SYRINGE) ×2 IMPLANT
TAPE FIBER 2MM 7IN #2 BLUE (SUTURE) IMPLANT
TOWEL GREEN STERILE FF (TOWEL DISPOSABLE) ×2 IMPLANT
TUBE CONNECTING 20X1/4 (TUBING) ×2 IMPLANT
TUBING ARTHROSCOPY IRRIG 16FT (MISCELLANEOUS) ×2 IMPLANT

## 2019-12-21 NOTE — Transfer of Care (Signed)
Immediate Anesthesia Transfer of Care Note  Patient: Carla Griffin  Procedure(s) Performed: SHOULDER ARTHROSCOPY WITH SUBACROMIAL DECOMPRESSION AND DISTAL CLAVICLE EXCISION (Right Shoulder) BICEPS TENODESIS (Right Shoulder)  Patient Location: PACU  Anesthesia Type:General and GA combined with regional for post-op pain  Level of Consciousness: sedated  Airway & Oxygen Therapy: Patient Spontanous Breathing and Patient connected to face mask oxygen  Post-op Assessment: Report given to RN and Post -op Vital signs reviewed and stable  Post vital signs: Reviewed and stable  Last Vitals:  Vitals Value Taken Time  BP    Temp    Pulse 82 12/21/19 1052  Resp    SpO2 98 % 12/21/19 1052  Vitals shown include unvalidated device data.  Last Pain:  Vitals:   12/21/19 0834  TempSrc: Oral      Patients Stated Pain Goal: 3 (12/21/19 0834)  Complications: No complications documented.

## 2019-12-21 NOTE — Progress Notes (Signed)
Assisted Dr. Howze with right, ultrasound guided, interscalene  block. Side rails up, monitors on throughout procedure. See vital signs in flow sheet. Tolerated Procedure well. °

## 2019-12-21 NOTE — Anesthesia Procedure Notes (Signed)
Procedure Name: Intubation Date/Time: 12/21/2019 9:37 AM Performed by: Willa Frater, CRNA Pre-anesthesia Checklist: Patient identified, Emergency Drugs available, Suction available and Patient being monitored Patient Re-evaluated:Patient Re-evaluated prior to induction Oxygen Delivery Method: Circle system utilized Preoxygenation: Pre-oxygenation with 100% oxygen Induction Type: IV induction Ventilation: Mask ventilation without difficulty Laryngoscope Size: Mac and 3 Grade View: Grade I Tube type: Oral Number of attempts: 1 Airway Equipment and Method: Stylet and Oral airway Placement Confirmation: ETT inserted through vocal cords under direct vision,  positive ETCO2 and breath sounds checked- equal and bilateral Secured at: 22 cm Tube secured with: Tape Dental Injury: Teeth and Oropharynx as per pre-operative assessment

## 2019-12-21 NOTE — Discharge Instructions (Signed)
Post Anesthesia Home Care Instructions  Activity: Get plenty of rest for the remainder of the day. A responsible individual must stay with you for 24 hours following the procedure.  For the next 24 hours, DO NOT: -Drive a car -Paediatric nurse -Drink alcoholic beverages -Take any medication unless instructed by your physician -Make any legal decisions or sign important papers.  Meals: Start with liquid foods such as gelatin or soup. Progress to regular foods as tolerated. Avoid greasy, spicy, heavy foods. If nausea and/or vomiting occur, drink only clear liquids until the nausea and/or vomiting subsides. Call your physician if vomiting continues.  Special Instructions/Symptoms: Your throat may feel dry or sore from the anesthesia or the breathing tube placed in your throat during surgery. If this causes discomfort, gargle with warm salt water. The discomfort should disappear within 24 hours.  If you had a scopolamine patch placed behind your ear for the management of post- operative nausea and/or vomiting:  1. The medication in the patch is effective for 72 hours, after which it should be removed.  Wrap patch in a tissue and discard in the trash. Wash hands thoroughly with soap and water. 2. You may remove the patch earlier than 72 hours if you experience unpleasant side effects which may include dry mouth, dizziness or visual disturbances. 3. Avoid touching the patch. Wash your hands with soap and water after contact with the patch.    Regional Anesthesia Blocks  1. Numbness or the inability to move the "blocked" extremity may last from 3-48 hours after placement. The length of time depends on the medication injected and your individual response to the medication. If the numbness is not going away after 48 hours, call your surgeon.  2. The extremity that is blocked will need to be protected until the numbness is gone and the  Strength has returned. Because you cannot feel it, you will  need to take extra care to avoid injury. Because it may be weak, you may have difficulty moving it or using it. You may not know what position it is in without looking at it while the block is in effect.  3. For blocks in the legs and feet, returning to weight bearing and walking needs to be done carefully. You will need to wait until the numbness is entirely gone and the strength has returned. You should be able to move your leg and foot normally before you try and bear weight or walk. You will need someone to be with you when you first try to ensure you do not fall and possibly risk injury.  4. Bruising and tenderness at the needle site are common side effects and will resolve in a few days.  5. Persistent numbness or new problems with movement should be communicated to the surgeon or the [place] [phone] [place] [phone]).  Information for Discharge Teaching: EXPAREL (bupivacaine liposome injectable suspension)   Your surgeon or anesthesiologist gave you EXPAREL(bupivacaine) to help control your pain after surgery.   EXPAREL is a local anesthetic that provides pain relief by numbing the tissue around the surgical site.  EXPAREL is designed to release pain medication over time and can control pain for up to 72 hours.  Depending on how you respond to EXPAREL, you may require less pain medication during your recovery.  Possible side effects:  Temporary loss of sensation or ability to move in the area where bupivacaine was injected.  Nausea, vomiting, constipation  Rarely, numbness and  tingling in your mouth or lips, lightheadedness, or anxiety may occur.  Call your doctor right away if you think you may be experiencing any of these sensations, or if you have other questions regarding possible side effects.  Follow all other discharge instructions given to you by your surgeon or nurse. Eat a healthy diet and drink plenty of water or other  fluids.  If you return to the hospital for any reason within 96 hours following the administration of EXPAREL, it is important for health care providers to know that you have received this anesthetic. A teal colored band has been placed on your arm with the date, time and amount of EXPAREL you have received in order to alert and inform your health care providers. Please leave this armband in place for the full 96 hours following administration, and then you may remove the band.   *May have Tylenol at 3pm today

## 2019-12-21 NOTE — Op Note (Signed)
Orthopaedic Surgery Operative Note (CSN: [id number])  Carla Griffin  Sep 21, 1980 Date of Surgery: 12/21/2019   Diagnoses:  RIGHT SHOULDER SLAP TEAR  Procedure: Arthroscopic extensive debridement Arthroscopic subacromial decompression Arthroscopic biceps tenodesis Arthroscopic distal clavicle excision Right shoulder loose body excision   Operative Finding Exam under anesthesia: Full motion, no limitation, patient had generalized hypermobility but no true instability or dislocation noted Articular space: Multiple cartilaginous loose bodies noted in the axillary recess, multiple loose bodies noted in the bicipital groove x6-8 overall. Chondral surfaces: Upper 30% of the glenoid was completely devoid of cartilage with only some fibrinous type scar tissue overlying the bone, there was a area of grade 4 cartilage loss in the central portion of the humeral head about 2 x 4 cm in size and rectangular in shape.  Unclear exactly what caused this patient's cartilage loss. Biceps: Type II SLAP tear. Subscapularis: Normal Superior Cuff: Normal Bursal side: Normal  Successful completion of the planned procedure.  Patient's cartilage loss was extremely surprising and much more severe than we were expecting.  We now worry about her stiffness and her ability to get through the surgery with good results.  Her biceps and SLAP pathology was handled without issue however she is likely to continue to have some pain due to a cartilage lesion.  She has bipolar loss would not be a great candidate for resurfacing.  She may require arthroplasty at a younger age than typical.   Post-operative plan: The patient will be non-weightbearing in a sling for 4 weeks with early therapy.  The patient will be discharged home.  DVT prophylaxis not indicated in ambulatory upper extremity patient without known risk factors.   Pain control with PRN pain medication preferring oral medicines.  Follow up plan will be scheduled in  approximately 7 days for incision check and XR.  Post-Op Diagnosis: Same Surgeons:Primary: Bjorn Pippin, MD Assistants:Caroline McBane PA-C Location: [facility] OR ROOM 6 Anesthesia: General with Exparel interscalene block Antibiotics: Ancef 2 g Tourniquet time: None Estimated Blood Loss: Minimal Complications: None Specimens: None Implants: Implant Name Type Inv. Item Serial No. Manufacturer Lot No. LRB No. Used Action  ANCHOR SUT 1.8 FIBERTAK 2 SUT - [id number] Anchor ANCHOR SUT 1.8 FIBERTAK 2 SUT  [employer] [id number] Right 1 Implanted  ANCHOR SUT 1.8 FIBERTAK 2 SUT - [id number] Anchor ANCHOR SUT 1.8 FIBERTAK 2 SUT  [employer] [id number] Right 1 Implanted    Indications for Surgery:   Carla Griffin is a 39 y.o. female with continued shoulder pain refractory to nonoperative measures for extended period of time.    The risks and benefits were explained at length including but not limited to continued pain, cuff failure, biceps tenodesis failure, stiffness, need for further surgery and infection.   Procedure:   Patient was correctly identified in the preoperative holding area and operative site marked.  Patient brought to OR and positioned beachchair on an [place] table ensuring that all bony prominences were padded and the head was in an appropriate location.  Anesthesia was induced and the operative shoulder was prepped and draped in the usual sterile fashion.  Timeout was called preincision.  A standard posterior viewing portal was made after localizing the portal with a spinal needle.  An anterior accessory portal was also made.  After clearing the articular space the camera was positioned in the subacromial space.  Findings above.    We performed a chondroplasty of the glenoid as well as the humeral head and multiple cartilaginous  loose bodies x6-8 and removed using a shaver and grasper.  The area of the cartilage damage as described above was debrided back to a stable base  Extensive  debridement was performed of the anterior interval tissue, labral fraying and the bursa.  Subacromial decompression: We made a lateral portal with spinal needle guidance. We then proceeded to debride bursal tissue extensively with a shaver and arthrocare device. At that point we continued to identify the borders of the acromion and identify the spur. We then carefully preserved the deltoid fascia and used a burr to convert the acromion to a Type 1 flat acromion without issue.  Biceps tenodesis: We marked the tendon and then performed a tenotomy and debridement of the stump in the articular space. We then identified the biceps tendon in its groove suprapec with the arthroscope in the lateral portal taking care to move from lateral to medial to avoid injury to the subscapularis. At that point we unroofed the tendon itself and mobilized it. An accessory anterior portal was made in line with the tendon and we grasped it from the anterior superior portal and worked from the accessory anterior portal. Two Fibertak 1.98mm knotless anchors were placed in the groove and the tendon was secured in a luggage loop style fashion with a pass of the limb of suture through the tendon using a scorpion device to avoid pull-through.  Repair was completed with good tension on the tendon.  Residual stump of the tendon was removed after being resected with a RF ablator.  Distal Clavicle resection:  The scope was placed in the subacromial space from the posterior portal.  A hemostat was placed through the anterior portal and we spread at the [facility] joint.  A burr was then inserted and 10 mm of distal clavicle was resected taking care to avoid damage to the capsule around the joint and avoiding overhanging bone posteriorly.     The incisions were closed with absorbable monocryl and steri strips.  A sterile dressing was placed along with a sling. The patient was awoken from general anesthesia and taken to the PACU in stable condition  without complication.   Alfonse Alpers, PA-C, present and scrubbed throughout the case, critical for completion in a timely fashion, and for retraction, instrumentation, closure.

## 2019-12-21 NOTE — Interval H&P Note (Signed)
All questions answered, patient elected to proceed

## 2019-12-21 NOTE — Anesthesia Preprocedure Evaluation (Signed)
Anesthesia Evaluation  Patient identified by MRN, date of birth, ID band Patient awake    Reviewed: Allergy & Precautions, NPO status , Patient's Chart, lab work & pertinent test results  History of Anesthesia Complications (+) PONV and history of anesthetic complications  Airway Mallampati: II  TM Distance: >3 FB Neck ROM: Full    Dental no notable dental hx.    Pulmonary neg pulmonary ROS,    Pulmonary exam normal        Cardiovascular Normal cardiovascular exam  POTS   Neuro/Psych negative neurological ROS  negative psych ROS   GI/Hepatic Neg liver ROS, GERD  Medicated and Controlled,  Endo/Other  negative endocrine ROS  Renal/GU negative Renal ROS  negative genitourinary   Musculoskeletal negative musculoskeletal ROS (+)   Abdominal   Peds  Hematology negative hematology ROS (+)   Anesthesia Other Findings Day of surgery medications reviewed with patient.  Reproductive/Obstetrics negative OB ROS                             Anesthesia Physical Anesthesia Plan  ASA: II  Anesthesia Plan: General   Post-op Pain Management: GA combined w/ Regional for post-op pain   Induction: Intravenous  PONV Risk Score and Plan: 4 or greater and Treatment may vary due to age or medical condition, Ondansetron, Dexamethasone, Midazolam and Scopolamine patch - Pre-op  Airway Management Planned: Oral ETT  Additional Equipment: None  Intra-op Plan:   Post-operative Plan: Extubation in OR  Informed Consent: I have reviewed the patients History and Physical, chart, labs and discussed the procedure including the risks, benefits and alternatives for the proposed anesthesia with the patient or authorized representative who has indicated his/her understanding and acceptance.     Dental advisory given  Plan Discussed with: CRNA  Anesthesia Plan Comments:         Anesthesia Quick  Evaluation

## 2019-12-21 NOTE — Anesthesia Postprocedure Evaluation (Signed)
Anesthesia Post Note  Patient: Marilla K Jeane  Procedure(s) Performed: SHOULDER ARTHROSCOPY WITH SUBACROMIAL DECOMPRESSION AND DISTAL CLAVICLE EXCISION (Right Shoulder) BICEPS TENODESIS (Right Shoulder)     Patient location during evaluation: PACU Anesthesia Type: General and Regional Level of consciousness: awake and alert Pain management: pain level controlled Vital Signs Assessment: post-procedure vital signs reviewed and stable Respiratory status: spontaneous breathing, nonlabored ventilation, respiratory function stable and patient connected to nasal cannula oxygen Cardiovascular status: blood pressure returned to baseline and stable Postop Assessment: no apparent nausea or vomiting Anesthetic complications: no   No complications documented.  Last Vitals:  Vitals:   12/21/19 1205 12/21/19 1228  BP:  (!) 96/57  Pulse:  64  Resp: 18 14  Temp:  (!) 36.4 C  SpO2:  97%    Last Pain:  Vitals:   12/21/19 1228  TempSrc: Oral  PainSc: 0-No pain                 Josedaniel Haye

## 2019-12-21 NOTE — Anesthesia Procedure Notes (Signed)
Anesthesia Regional Block: Interscalene brachial plexus block   Pre-Anesthetic Checklist: ,, timeout performed, Correct Patient, Correct Site, Correct Laterality, Correct Procedure, Correct Position, site marked, Risks and benefits discussed, pre-op evaluation,  At surgeon's request and post-op pain management  Laterality: Right  Prep: Maximum Sterile Barrier Precautions used, chloraprep       Needles:  Injection technique: Single-shot  Needle Type: Echogenic Stimulator Needle     Needle Length: 9cm  Needle Gauge: 22     Additional Needles:   Procedures:,,,, ultrasound used (permanent image in chart),,,,  Narrative:  Start time: 12/21/2019 9:00 AM End time: 12/21/2019 9:02 AM Injection made incrementally with aspirations every 5 mL.  Performed by: Personally  Anesthesiologist: Kaylyn Layer, MD  Additional Notes: Risks, benefits, and alternative discussed. Patient gave consent for procedure. Patient prepped and draped in sterile fashion. Sedation administered, patient remains easily responsive to voice. Relevant anatomy identified with ultrasound guidance. Local anesthetic given in 5cc increments with no signs or symptoms of intravascular injection. No pain or paraesthesias with injection. Patient monitored throughout procedure with signs of LAST or immediate complications. Tolerated well. Ultrasound image placed in chart.  Amalia Greenhouse, MD

## 2019-12-22 ENCOUNTER — Encounter (HOSPITAL_BASED_OUTPATIENT_CLINIC_OR_DEPARTMENT_OTHER): Payer: Self-pay | Admitting: Orthopaedic Surgery

## 2019-12-26 DIAGNOSIS — M7541 Impingement syndrome of right shoulder: Secondary | ICD-10-CM | POA: Diagnosis not present

## 2019-12-26 DIAGNOSIS — M25611 Stiffness of right shoulder, not elsewhere classified: Secondary | ICD-10-CM | POA: Diagnosis not present

## 2019-12-26 DIAGNOSIS — M7521 Bicipital tendinitis, right shoulder: Secondary | ICD-10-CM | POA: Diagnosis not present

## 2019-12-26 DIAGNOSIS — M6281 Muscle weakness (generalized): Secondary | ICD-10-CM | POA: Diagnosis not present

## 2019-12-28 DIAGNOSIS — M19011 Primary osteoarthritis, right shoulder: Secondary | ICD-10-CM | POA: Diagnosis not present

## 2019-12-29 DIAGNOSIS — Z79899 Other long term (current) drug therapy: Secondary | ICD-10-CM | POA: Diagnosis not present

## 2019-12-29 DIAGNOSIS — M24111 Other articular cartilage disorders, right shoulder: Secondary | ICD-10-CM | POA: Diagnosis not present

## 2019-12-29 DIAGNOSIS — R030 Elevated blood-pressure reading, without diagnosis of hypertension: Secondary | ICD-10-CM | POA: Diagnosis not present

## 2019-12-29 DIAGNOSIS — K219 Gastro-esophageal reflux disease without esophagitis: Secondary | ICD-10-CM | POA: Diagnosis not present

## 2019-12-29 DIAGNOSIS — M25611 Stiffness of right shoulder, not elsewhere classified: Secondary | ICD-10-CM | POA: Diagnosis not present

## 2019-12-29 DIAGNOSIS — R531 Weakness: Secondary | ICD-10-CM | POA: Diagnosis not present

## 2019-12-29 DIAGNOSIS — R079 Chest pain, unspecified: Secondary | ICD-10-CM | POA: Diagnosis not present

## 2019-12-29 DIAGNOSIS — M25511 Pain in right shoulder: Secondary | ICD-10-CM | POA: Diagnosis not present

## 2019-12-29 DIAGNOSIS — R0789 Other chest pain: Secondary | ICD-10-CM | POA: Diagnosis not present

## 2019-12-29 DIAGNOSIS — M6281 Muscle weakness (generalized): Secondary | ICD-10-CM | POA: Diagnosis not present

## 2020-01-01 ENCOUNTER — Encounter: Payer: Self-pay | Admitting: Osteopathic Medicine

## 2020-01-02 DIAGNOSIS — M24111 Other articular cartilage disorders, right shoulder: Secondary | ICD-10-CM | POA: Diagnosis not present

## 2020-01-02 DIAGNOSIS — M25611 Stiffness of right shoulder, not elsewhere classified: Secondary | ICD-10-CM | POA: Diagnosis not present

## 2020-01-02 DIAGNOSIS — M6281 Muscle weakness (generalized): Secondary | ICD-10-CM | POA: Diagnosis not present

## 2020-01-02 DIAGNOSIS — M25511 Pain in right shoulder: Secondary | ICD-10-CM | POA: Diagnosis not present

## 2020-01-04 DIAGNOSIS — M7541 Impingement syndrome of right shoulder: Secondary | ICD-10-CM | POA: Diagnosis not present

## 2020-01-04 DIAGNOSIS — M7521 Bicipital tendinitis, right shoulder: Secondary | ICD-10-CM | POA: Diagnosis not present

## 2020-01-04 DIAGNOSIS — M25611 Stiffness of right shoulder, not elsewhere classified: Secondary | ICD-10-CM | POA: Diagnosis not present

## 2020-01-04 DIAGNOSIS — M24111 Other articular cartilage disorders, right shoulder: Secondary | ICD-10-CM | POA: Diagnosis not present

## 2020-01-09 ENCOUNTER — Encounter: Payer: Self-pay | Admitting: Osteopathic Medicine

## 2020-01-09 ENCOUNTER — Other Ambulatory Visit: Payer: Self-pay

## 2020-01-09 ENCOUNTER — Ambulatory Visit (INDEPENDENT_AMBULATORY_CARE_PROVIDER_SITE_OTHER): Payer: BLUE CROSS/BLUE SHIELD | Admitting: Osteopathic Medicine

## 2020-01-09 VITALS — BP 108/71 | HR 109 | Temp 98.3°F | Wt 152.6 lb

## 2020-01-09 DIAGNOSIS — M6281 Muscle weakness (generalized): Secondary | ICD-10-CM | POA: Diagnosis not present

## 2020-01-09 DIAGNOSIS — M7541 Impingement syndrome of right shoulder: Secondary | ICD-10-CM | POA: Diagnosis not present

## 2020-01-09 DIAGNOSIS — Z09 Encounter for follow-up examination after completed treatment for conditions other than malignant neoplasm: Secondary | ICD-10-CM

## 2020-01-09 DIAGNOSIS — M7521 Bicipital tendinitis, right shoulder: Secondary | ICD-10-CM | POA: Diagnosis not present

## 2020-01-09 DIAGNOSIS — M25661 Stiffness of right knee, not elsewhere classified: Secondary | ICD-10-CM | POA: Diagnosis not present

## 2020-01-09 NOTE — Progress Notes (Signed)
HPI: Carla Griffin is a 39 y.o. female who  has a past medical history of Gallstones, GERD (gastroesophageal reflux disease), IBS (irritable bowel syndrome), Pneumonia, PONV (postoperative nausea and vomiting), POTS (postural orthostatic tachycardia syndrome), and Urinary tract infection.  she presents to [facility] today, 01/09/20,  for chief complaint of: ER follow up- chest pain  Patient reports she had arthroscopic surgery on her shoulder on 7/29. She did not take pain medications afterwards because they irritated her stomach, but on 8/6, she was having significant pain, so she took one of her oxycodone. Afterwards she noted pressure like chest pain and tightness with some SOB. She also measured her BP at that time and it was 70s/30s. She went to the ED to be evaluated. Per records review, she had a CXR, EKG and troponins, CBC, CMP, and Mg, which were normal. She states the pain resolved after about an hour, and she has not had any further chest pain since then. She states her BP has been back at her baseline since then, but her heart rate has been higher than usual (100s, baseline 70s).   Past medical, surgical, social and family history reviewed:  Patient Active Problem List   Diagnosis Date Noted  . Chronic right shoulder pain 11/08/2019  . Postural orthostatic tachycardia syndrome 11/23/2016  . Gynecologic exam normal 06/23/2016  . Contraception management 06/23/2016  . Perimenopausal vasomotor symptoms 06/23/2016  . Heartburn   . Dyspepsia   . Left sided abdominal pain 01/08/2016  . Nausea without vomiting 01/08/2016  . Loose stools 01/08/2016  . Generalized abdominal cramps 01/08/2016  . Early satiety 01/08/2016  . Swallowing difficulty 01/08/2016  . Elevated ALT measurement 01/08/2016  . Palpitations 08/14/2015  . Gastroesophageal reflux disease without esophagitis 08/14/2015  . Left-sided thoracic back pain 04/29/2015  . Fatigue 04/29/2015   . Vitamin D deficiency 05/06/2009  . BACK PAIN WITH RADICULOPATHY 02/15/2009    Past Surgical History:  Procedure Laterality Date  . BICEPT TENODESIS Right 12/21/2019   Procedure: BICEPS TENODESIS;  Surgeon: Bjorn Pippin, MD;  Location: [facility];  Service: Orthopedics;  Laterality: Right;  . BRAVO PH STUDY N/A 03/03/2016   Procedure: BRAVO PH STUDY;  Surgeon: Napoleon Form, MD;  Location: [facility];  Service: Endoscopy;  Laterality: N/A;  . CHOLECYSTECTOMY  2014  . ESOPHAGOGASTRODUODENOSCOPY (EGD) WITH PROPOFOL N/A 03/03/2016   Procedure: ESOPHAGOGASTRODUODENOSCOPY (EGD) WITH PROPOFOL;  Surgeon: Napoleon Form, MD;  Location: [facility];  Service: Endoscopy;  Laterality: N/A;  . WISDOM TOOTH EXTRACTION      Social History   Tobacco Use  . Smoking status: Never Smoker  . Smokeless tobacco: Never Used  Substance Use Topics  . Alcohol use: Yes    Alcohol/week: 0.0 standard drinks    Comment: occasional    Family History  Problem Relation Age of Onset  . Lupus Mother   . Hyperlipidemia Father   . Hypertension Father   . Diabetes Maternal Grandfather   . Heart attack Maternal Grandfather   . Stroke Paternal Grandfather      Current medication list and allergy/intolerance information reviewed:    Current Outpatient Medications  Medication Sig Dispense Refill  . diltiazem (CARDIZEM CD) 240 MG 24 hr capsule TAKE 1 CAPSULE BY MOUTH EVERY DAY 90 capsule 3  . esomeprazole (NEXIUM) 40 MG capsule Take 1 capsule (40 mg total) by mouth 2 (two) times daily before a meal. 180 capsule 3  . Multiple Vitamin (  MULTIVITAMIN) tablet Take 1 tablet by mouth daily.    . norethindrone-ethinyl estradiol (JUNEL FE 1/20) 1-20 MG-MCG tablet Take 1 tablet by mouth daily. 84 tablet 11  . Probiotic Product (ALIGN) 4 MG CAPS Take 4 mg by mouth daily before breakfast.      No current facility-administered medications for this visit.    No Known Allergies    Review of  Systems:  Constitutional:  No  fever, no chills   HEENT: No  headache, no vision change  Cardiac: No  chest pain, No  pressure, No palpitations, No  Orthopnea  Respiratory:  No  shortness of breath. No  Cough  Gastrointestinal: No  abdominal pain  Musculoskeletal: stable R shoulder discomfort post-op, well controlled  Skin: No  Rash, No other wounds/concerning lesions  Neurologic: No  weakness, No  dizziness  Psychiatric: No  concerns with depression, No  concerns with anxiety   Exam:  BP 108/71   Pulse (!) 109   Temp 98.3 F (36.8 C) (Oral)   Wt 152 lb 9.6 oz (69.2 kg)   LMP 01/08/2020 (Exact Date)   SpO2 100% Comment: on RA  BMI 24.63 kg/m   Constitutional: VS see above. General Appearance: alert, well-developed, well-nourished, NAD  Eyes: Normal lids and conjunctive, non-icteric sclera  Neck: No masses, trachea midline.   Respiratory: Normal respiratory effort. no wheeze, no rhonchi, no rales  Cardiovascular: S1/S2 normal, no murmur, no rub/gallop auscultated. RRR. No lower extremity edema.  Gastrointestinal: Nontender, no masses.   Musculoskeletal: Gait normal. No clubbing/cyanosis of digits.   Neurological: Normal balance/coordination. No tremor. No cranial nerve deficit on limited exam.  Skin: warm, dry, intact. No rash/ulcer. No concerning nevi or subq nodules on limited exam.   Psychiatric: Normal judgment/insight. Normal mood and affect. Oriented x3.    No results found for this or any previous visit (from the past 72 hour(s)).  No results found.   ASSESSMENT/PLAN: The encounter diagnosis was Encounter for examination following treatment at hospital - ED visit for chest pain / hypotension.   Chest Pain ER follow up  Given patient's tachycardia at home and in the office today and her recent surgery, there is a possibility for PE, though my suspicion for this is low, considering her surgery was arthroscopic, she has not been immobile, and she  demonstrates no symptoms of a DVT. Additionally, her tachycardia appears to be intermittent; it was regular rate on my exam. I think Well's score overestimates risk in her case.   We discussed option for D-Dimer, but for above reasons in low-risk patient and no current symptoms, we agreed that it is not necessary at this time. We also agreed to send notes to her cardiologist so they can follow up with her as needed.  This may have been an atypical reaction to opiates, since it has not recurred. Patient states pain is generally well controlled, so we will not order any further pain medications.  No orders of the defined types were placed in this encounter.   No orders of the defined types were placed in this encounter.   Patient Instructions  If symptoms recur, or other concerns, let me know. If chest tightness/ shortness of breath, please go to ER / call 911!   I'll ask cardiology, but I don't think we need to be worried about a PE or other significant complications. They may want to see you back sooner vs monitor.            Visit  summary with medication list and pertinent instructions was printed for patient to review. All questions at time of visit were answered - patient instructed to contact office with any additional concerns or updates. ER/RTC precautions were reviewed with the patient.     Please note: voice recognition software was used to produce this document, and typos may escape review. Please contact Dr. Lyn Hollingshead for any needed clarifications.     Follow-up plan: Return in about 6 months (around 07/11/2020), or if symptoms worsen or fail to improve, for ANNUAL (call week prior to visit for lab orders).  Rutha Bouchard, [facility] MS3

## 2020-01-09 NOTE — Patient Instructions (Signed)
If symptoms recur, or other concerns, let me know. If chest tightness/ shortness of breath, please go to ER / call 911!   I'll ask cardiology, but I don't think we need to be worried about a PE or other significant complications. They may want to see you back sooner vs monitor.

## 2020-01-11 DIAGNOSIS — M6281 Muscle weakness (generalized): Secondary | ICD-10-CM | POA: Diagnosis not present

## 2020-01-11 DIAGNOSIS — M25511 Pain in right shoulder: Secondary | ICD-10-CM | POA: Diagnosis not present

## 2020-01-11 DIAGNOSIS — M25611 Stiffness of right shoulder, not elsewhere classified: Secondary | ICD-10-CM | POA: Diagnosis not present

## 2020-01-11 DIAGNOSIS — M24111 Other articular cartilage disorders, right shoulder: Secondary | ICD-10-CM | POA: Diagnosis not present

## 2020-01-16 DIAGNOSIS — M24111 Other articular cartilage disorders, right shoulder: Secondary | ICD-10-CM | POA: Diagnosis not present

## 2020-01-16 DIAGNOSIS — M6281 Muscle weakness (generalized): Secondary | ICD-10-CM | POA: Diagnosis not present

## 2020-01-16 DIAGNOSIS — M25511 Pain in right shoulder: Secondary | ICD-10-CM | POA: Diagnosis not present

## 2020-01-16 DIAGNOSIS — M25611 Stiffness of right shoulder, not elsewhere classified: Secondary | ICD-10-CM | POA: Diagnosis not present

## 2020-01-18 DIAGNOSIS — M24111 Other articular cartilage disorders, right shoulder: Secondary | ICD-10-CM | POA: Diagnosis not present

## 2020-01-18 DIAGNOSIS — M25511 Pain in right shoulder: Secondary | ICD-10-CM | POA: Diagnosis not present

## 2020-01-18 DIAGNOSIS — M6281 Muscle weakness (generalized): Secondary | ICD-10-CM | POA: Diagnosis not present

## 2020-01-18 DIAGNOSIS — M25611 Stiffness of right shoulder, not elsewhere classified: Secondary | ICD-10-CM | POA: Diagnosis not present

## 2020-01-23 DIAGNOSIS — M24111 Other articular cartilage disorders, right shoulder: Secondary | ICD-10-CM | POA: Diagnosis not present

## 2020-01-23 DIAGNOSIS — M6281 Muscle weakness (generalized): Secondary | ICD-10-CM | POA: Diagnosis not present

## 2020-01-23 DIAGNOSIS — M25511 Pain in right shoulder: Secondary | ICD-10-CM | POA: Diagnosis not present

## 2020-01-23 DIAGNOSIS — M25611 Stiffness of right shoulder, not elsewhere classified: Secondary | ICD-10-CM | POA: Diagnosis not present

## 2020-01-25 DIAGNOSIS — M6281 Muscle weakness (generalized): Secondary | ICD-10-CM | POA: Diagnosis not present

## 2020-01-25 DIAGNOSIS — M25511 Pain in right shoulder: Secondary | ICD-10-CM | POA: Diagnosis not present

## 2020-01-25 DIAGNOSIS — M25611 Stiffness of right shoulder, not elsewhere classified: Secondary | ICD-10-CM | POA: Diagnosis not present

## 2020-01-25 DIAGNOSIS — M24111 Other articular cartilage disorders, right shoulder: Secondary | ICD-10-CM | POA: Diagnosis not present

## 2020-01-30 DIAGNOSIS — M24111 Other articular cartilage disorders, right shoulder: Secondary | ICD-10-CM | POA: Diagnosis not present

## 2020-01-30 DIAGNOSIS — M25511 Pain in right shoulder: Secondary | ICD-10-CM | POA: Diagnosis not present

## 2020-01-30 DIAGNOSIS — M6281 Muscle weakness (generalized): Secondary | ICD-10-CM | POA: Diagnosis not present

## 2020-01-30 DIAGNOSIS — M25611 Stiffness of right shoulder, not elsewhere classified: Secondary | ICD-10-CM | POA: Diagnosis not present

## 2020-02-01 DIAGNOSIS — M7541 Impingement syndrome of right shoulder: Secondary | ICD-10-CM | POA: Diagnosis not present

## 2020-02-01 DIAGNOSIS — M6281 Muscle weakness (generalized): Secondary | ICD-10-CM | POA: Diagnosis not present

## 2020-02-01 DIAGNOSIS — M25611 Stiffness of right shoulder, not elsewhere classified: Secondary | ICD-10-CM | POA: Diagnosis not present

## 2020-02-01 DIAGNOSIS — M24111 Other articular cartilage disorders, right shoulder: Secondary | ICD-10-CM | POA: Diagnosis not present

## 2020-02-05 DIAGNOSIS — M25611 Stiffness of right shoulder, not elsewhere classified: Secondary | ICD-10-CM | POA: Diagnosis not present

## 2020-02-05 DIAGNOSIS — M24111 Other articular cartilage disorders, right shoulder: Secondary | ICD-10-CM | POA: Diagnosis not present

## 2020-02-05 DIAGNOSIS — M25511 Pain in right shoulder: Secondary | ICD-10-CM | POA: Diagnosis not present

## 2020-02-05 DIAGNOSIS — M6281 Muscle weakness (generalized): Secondary | ICD-10-CM | POA: Diagnosis not present

## 2020-02-09 DIAGNOSIS — M25611 Stiffness of right shoulder, not elsewhere classified: Secondary | ICD-10-CM | POA: Diagnosis not present

## 2020-02-09 DIAGNOSIS — M7541 Impingement syndrome of right shoulder: Secondary | ICD-10-CM | POA: Diagnosis not present

## 2020-02-09 DIAGNOSIS — M6281 Muscle weakness (generalized): Secondary | ICD-10-CM | POA: Diagnosis not present

## 2020-02-09 DIAGNOSIS — M7521 Bicipital tendinitis, right shoulder: Secondary | ICD-10-CM | POA: Diagnosis not present

## 2020-02-13 DIAGNOSIS — M7541 Impingement syndrome of right shoulder: Secondary | ICD-10-CM | POA: Diagnosis not present

## 2020-02-13 DIAGNOSIS — M24111 Other articular cartilage disorders, right shoulder: Secondary | ICD-10-CM | POA: Diagnosis not present

## 2020-02-13 DIAGNOSIS — M6281 Muscle weakness (generalized): Secondary | ICD-10-CM | POA: Diagnosis not present

## 2020-02-13 DIAGNOSIS — M25611 Stiffness of right shoulder, not elsewhere classified: Secondary | ICD-10-CM | POA: Diagnosis not present

## 2020-02-15 DIAGNOSIS — M542 Cervicalgia: Secondary | ICD-10-CM | POA: Diagnosis not present

## 2020-02-20 DIAGNOSIS — M25611 Stiffness of right shoulder, not elsewhere classified: Secondary | ICD-10-CM | POA: Diagnosis not present

## 2020-02-20 DIAGNOSIS — M6281 Muscle weakness (generalized): Secondary | ICD-10-CM | POA: Diagnosis not present

## 2020-02-20 DIAGNOSIS — M24111 Other articular cartilage disorders, right shoulder: Secondary | ICD-10-CM | POA: Diagnosis not present

## 2020-02-20 DIAGNOSIS — M25511 Pain in right shoulder: Secondary | ICD-10-CM | POA: Diagnosis not present

## 2020-02-23 DIAGNOSIS — M542 Cervicalgia: Secondary | ICD-10-CM | POA: Diagnosis not present

## 2020-02-26 DIAGNOSIS — M25511 Pain in right shoulder: Secondary | ICD-10-CM | POA: Diagnosis not present

## 2020-02-26 DIAGNOSIS — M25611 Stiffness of right shoulder, not elsewhere classified: Secondary | ICD-10-CM | POA: Diagnosis not present

## 2020-02-26 DIAGNOSIS — M6281 Muscle weakness (generalized): Secondary | ICD-10-CM | POA: Diagnosis not present

## 2020-02-26 DIAGNOSIS — M24111 Other articular cartilage disorders, right shoulder: Secondary | ICD-10-CM | POA: Diagnosis not present

## 2020-02-27 DIAGNOSIS — M24111 Other articular cartilage disorders, right shoulder: Secondary | ICD-10-CM | POA: Diagnosis not present

## 2020-02-27 DIAGNOSIS — M542 Cervicalgia: Secondary | ICD-10-CM | POA: Diagnosis not present

## 2020-02-29 DIAGNOSIS — M542 Cervicalgia: Secondary | ICD-10-CM | POA: Diagnosis not present

## 2020-03-01 DIAGNOSIS — M542 Cervicalgia: Secondary | ICD-10-CM | POA: Diagnosis not present

## 2020-03-05 DIAGNOSIS — M7521 Bicipital tendinitis, right shoulder: Secondary | ICD-10-CM | POA: Diagnosis not present

## 2020-03-05 DIAGNOSIS — M25611 Stiffness of right shoulder, not elsewhere classified: Secondary | ICD-10-CM | POA: Diagnosis not present

## 2020-03-05 DIAGNOSIS — M25511 Pain in right shoulder: Secondary | ICD-10-CM | POA: Diagnosis not present

## 2020-03-05 DIAGNOSIS — M6281 Muscle weakness (generalized): Secondary | ICD-10-CM | POA: Diagnosis not present

## 2020-03-07 DIAGNOSIS — M24111 Other articular cartilage disorders, right shoulder: Secondary | ICD-10-CM | POA: Diagnosis not present

## 2020-03-07 DIAGNOSIS — M7541 Impingement syndrome of right shoulder: Secondary | ICD-10-CM | POA: Diagnosis not present

## 2020-03-07 DIAGNOSIS — M7521 Bicipital tendinitis, right shoulder: Secondary | ICD-10-CM | POA: Diagnosis not present

## 2020-03-07 DIAGNOSIS — M25611 Stiffness of right shoulder, not elsewhere classified: Secondary | ICD-10-CM | POA: Diagnosis not present

## 2020-03-14 DIAGNOSIS — M25611 Stiffness of right shoulder, not elsewhere classified: Secondary | ICD-10-CM | POA: Diagnosis not present

## 2020-03-14 DIAGNOSIS — M6281 Muscle weakness (generalized): Secondary | ICD-10-CM | POA: Diagnosis not present

## 2020-03-14 DIAGNOSIS — M7521 Bicipital tendinitis, right shoulder: Secondary | ICD-10-CM | POA: Diagnosis not present

## 2020-03-14 DIAGNOSIS — M7541 Impingement syndrome of right shoulder: Secondary | ICD-10-CM | POA: Diagnosis not present

## 2020-03-21 DIAGNOSIS — M24111 Other articular cartilage disorders, right shoulder: Secondary | ICD-10-CM | POA: Diagnosis not present

## 2020-03-21 DIAGNOSIS — M25511 Pain in right shoulder: Secondary | ICD-10-CM | POA: Diagnosis not present

## 2020-03-21 DIAGNOSIS — M6281 Muscle weakness (generalized): Secondary | ICD-10-CM | POA: Diagnosis not present

## 2020-03-21 DIAGNOSIS — M25611 Stiffness of right shoulder, not elsewhere classified: Secondary | ICD-10-CM | POA: Diagnosis not present

## 2020-03-21 DIAGNOSIS — M5412 Radiculopathy, cervical region: Secondary | ICD-10-CM | POA: Diagnosis not present

## 2020-03-27 DIAGNOSIS — M7541 Impingement syndrome of right shoulder: Secondary | ICD-10-CM | POA: Diagnosis not present

## 2020-03-27 DIAGNOSIS — M24111 Other articular cartilage disorders, right shoulder: Secondary | ICD-10-CM | POA: Diagnosis not present

## 2020-03-27 DIAGNOSIS — M25611 Stiffness of right shoulder, not elsewhere classified: Secondary | ICD-10-CM | POA: Diagnosis not present

## 2020-03-27 DIAGNOSIS — M6281 Muscle weakness (generalized): Secondary | ICD-10-CM | POA: Diagnosis not present

## 2020-03-29 DIAGNOSIS — M25611 Stiffness of right shoulder, not elsewhere classified: Secondary | ICD-10-CM | POA: Diagnosis not present

## 2020-03-29 DIAGNOSIS — M7541 Impingement syndrome of right shoulder: Secondary | ICD-10-CM | POA: Diagnosis not present

## 2020-03-29 DIAGNOSIS — M7521 Bicipital tendinitis, right shoulder: Secondary | ICD-10-CM | POA: Diagnosis not present

## 2020-03-29 DIAGNOSIS — M6281 Muscle weakness (generalized): Secondary | ICD-10-CM | POA: Diagnosis not present

## 2020-04-15 ENCOUNTER — Other Ambulatory Visit: Payer: Self-pay

## 2020-04-15 ENCOUNTER — Encounter (HOSPITAL_BASED_OUTPATIENT_CLINIC_OR_DEPARTMENT_OTHER): Payer: Self-pay | Admitting: Orthopaedic Surgery

## 2020-04-22 ENCOUNTER — Other Ambulatory Visit (HOSPITAL_COMMUNITY)
Admission: RE | Admit: 2020-04-22 | Discharge: 2020-04-22 | Disposition: A | Discharge status: Home / Self Care | Payer: BLUE CROSS/BLUE SHIELD | Source: Ambulatory Visit | Attending: Orthopaedic Surgery | Admitting: Orthopaedic Surgery

## 2020-04-22 DIAGNOSIS — Z20822 Contact with and (suspected) exposure to covid-19: Secondary | ICD-10-CM | POA: Insufficient documentation

## 2020-04-22 DIAGNOSIS — I498 Other specified cardiac arrhythmias: Secondary | ICD-10-CM | POA: Diagnosis not present

## 2020-04-22 DIAGNOSIS — K589 Irritable bowel syndrome without diarrhea: Secondary | ICD-10-CM | POA: Diagnosis not present

## 2020-04-22 DIAGNOSIS — M24611 Ankylosis, right shoulder: Secondary | ICD-10-CM | POA: Diagnosis not present

## 2020-04-22 DIAGNOSIS — Z79899 Other long term (current) drug therapy: Secondary | ICD-10-CM | POA: Diagnosis not present

## 2020-04-22 DIAGNOSIS — Z01812 Encounter for preprocedural laboratory examination: Secondary | ICD-10-CM | POA: Insufficient documentation

## 2020-04-22 DIAGNOSIS — K219 Gastro-esophageal reflux disease without esophagitis: Secondary | ICD-10-CM | POA: Diagnosis not present

## 2020-04-22 LAB — SARS CORONAVIRUS 2 (TAT 6-24 HRS): SARS Coronavirus 2: NEGATIVE

## 2020-04-22 NOTE — Progress Notes (Signed)

## 2020-04-24 NOTE — H&P (Signed)
PREOPERATIVE H&P  Chief Complaint: ADHESIVE CAPSULITIS OF RIGHT SHOULDER  HPI: Carla Griffin is a 39 y.o. female who is scheduled for, Procedure(s): ARTHROSCOPY RIGHT  SHOULDER WITH LYSIS RESECT ADHESIONS WITH MANIPULATION.   Patient has a past medical history significant for POTS, IBS, GERD.   Patient is a 39 year old who had right shoulder surgery for a SLAP tear, impingement, AC arthrosis on 7/29/221 with Dr. Everardo Pacific. In the OR it was found that her cartilage loss was  much more severe than we were expecting. She has had stiffness after surgery as well as radicular type pain. She had a right C7-T1 interlaminar epidural injection under fluoroscopic guidance with Dr. Maurice Small on 03/21/2020 and did not get full relief after this. Her stiffness is causing her limitations.   Her symptoms are rated as moderate to severe, and have been worsening.  This is significantly impairing activities of daily living.    Please see clinic note for further details on this patient's care.    She has elected for surgical management.   Past Medical History:  Diagnosis Date  . Gallstones   . GERD (gastroesophageal reflux disease)   . IBS (irritable bowel syndrome)   . Pneumonia   . PONV (postoperative nausea and vomiting)   . POTS (postural orthostatic tachycardia syndrome)   . Urinary tract infection    Past Surgical History:  Procedure Laterality Date  . BICEPT TENODESIS Right 12/21/2019   Procedure: BICEPS TENODESIS;  Surgeon: Bjorn Pippin, MD;  Location: [facility];  Service: Orthopedics;  Laterality: Right;  . BRAVO PH STUDY N/A 03/03/2016   Procedure: BRAVO PH STUDY;  Surgeon: Napoleon Form, MD;  Location: [facility];  Service: Endoscopy;  Laterality: N/A;  . CHOLECYSTECTOMY  2014  . ESOPHAGOGASTRODUODENOSCOPY (EGD) WITH PROPOFOL N/A 03/03/2016   Procedure: ESOPHAGOGASTRODUODENOSCOPY (EGD) WITH PROPOFOL;  Surgeon: Napoleon Form, MD;  Location: [facility];   Service: Endoscopy;  Laterality: N/A;  . WISDOM TOOTH EXTRACTION     Social History   Socioeconomic History  . Marital status: Married    Spouse name: Not on file  . Number of children: 2  . Years of education: Not on file  . Highest education level: Not on file  Occupational History  . Occupation: Engineer, civil (consulting) at [employer]    Comment: Ortho Floor  Tobacco Use  . Smoking status: Never Smoker  . Smokeless tobacco: Never Used  Vaping Use  . Vaping Use: Never used  Substance and Sexual Activity  . Alcohol use: Not Currently    Alcohol/week: 0.0 standard drinks    Comment: occasional  . Drug use: No  . Sexual activity: Yes    Birth control/protection: Pill  Other Topics Concern  . Not on file  Social History Narrative   Lives with husband in [place].   Active in her church.   Regular exercise:  Yes   RN at [employer]   Social Determinants of Health   Financial Resource Strain:   . Difficulty of Paying Living Expenses: Not on file  Food Insecurity:   . Worried About Programme researcher, broadcasting/film/video in the Last Year: Not on file  . Ran Out of Food in the Last Year: Not on file  Transportation Needs:   . Lack of Transportation (Medical): Not on file  . Lack of Transportation (Non-Medical): Not on file  Physical Activity:   . Days of Exercise per Week: Not on file  . Minutes of Exercise per Session:  Not on file  Stress:   . Feeling of Stress : Not on file  Social Connections:   . Frequency of Communication with Friends and Family: Not on file  . Frequency of Social Gatherings with Friends and Family: Not on file  . Attends Religious Services: Not on file  . Active Member of Clubs or Organizations: Not on file  . Attends Banker Meetings: Not on file  . Marital Status: Not on file   Family History  Problem Relation Age of Onset  . Lupus Mother   . Hyperlipidemia Father   . Hypertension Father   . Diabetes Maternal Grandfather   . Heart attack Maternal Grandfather   . Stroke  Paternal Grandfather    No Known Allergies Prior to Admission medications   Medication Sig Start Date End Date Taking? Authorizing Provider  diltiazem (CARDIZEM CD) 240 MG 24 hr capsule TAKE 1 CAPSULE BY MOUTH EVERY DAY 06/29/19  Yes Camnitz, Will Daphine Deutscher, MD  esomeprazole (NEXIUM) 40 MG capsule Take 1 capsule (40 mg total) by mouth 2 (two) times daily before a meal. 02/19/17  Yes Nandigam, Eleonore Chiquito, MD  Multiple Vitamin (MULTIVITAMIN) tablet Take 1 tablet by mouth daily.   Yes [provider]  norethindrone-ethinyl estradiol (JUNEL FE 1/20) 1-20 MG-MCG tablet Take 1 tablet by mouth daily. 12/08/19  Yes Rolm Bookbinder, CNM  Probiotic Product (ALIGN) 4 MG CAPS Take 4 mg by mouth daily before breakfast.    Yes [provider]    ROS: All other systems have been reviewed and were otherwise negative with the exception of those mentioned in the HPI and as above.  Physical Exam: General: Alert, no acute distress Cardiovascular: No pedal edema Respiratory: No cyanosis, no use of accessory musculature GI: No organomegaly, abdomen is soft and non-tender Skin: No lesions in the area of chief complaint Neurologic: Sensation intact distally Psychiatric: Patient is competent for consent with normal mood and affect Lymphatic: No axillary or cervical lymphadenopathy  MUSCULOSKELETAL:  Right shoulder demonstrates range of motion to about 140 degrees passively and actively; external rotation to about 30.     Imaging: No new shoulder imaging  Assessment: ADHESIVE CAPSULITIS OF RIGHT SHOULDER  Plan: Plan for Procedure(s): ARTHROSCOPY RIGHT  SHOULDER WITH LYSIS RESECT ADHESIONS WITH MANIPULATION  The risks benefits and alternatives were discussed with the patient including but not limited to the risks of nonoperative treatment, versus surgical intervention including infection, bleeding, nerve injury,  blood clots, cardiopulmonary complications, morbidity, mortality, among others,  and they were willing to proceed.   The patient acknowledged the explanation, agreed to proceed with the plan and consent was signed.   Operative Plan: Right shoulder scope with lysis of adhesions and manipulation Discharge Medications: Tylenol, Meloxicam, Tramadol (oxycodone causes low BP), Zofran DVT Prophylaxis: None Physical Therapy: Outpatient PT Special Discharge needs: Sling   Vernetta Honey, PA-C  04/24/2020 4:37 PM

## 2020-04-25 ENCOUNTER — Other Ambulatory Visit: Payer: Self-pay

## 2020-04-25 ENCOUNTER — Encounter (HOSPITAL_BASED_OUTPATIENT_CLINIC_OR_DEPARTMENT_OTHER): Payer: Self-pay | Admitting: Orthopaedic Surgery

## 2020-04-25 ENCOUNTER — Encounter (HOSPITAL_BASED_OUTPATIENT_CLINIC_OR_DEPARTMENT_OTHER)
Admission: RE | Disposition: A | Discharge status: Home / Self Care | Payer: Self-pay | Source: Home / Self Care | Attending: Orthopaedic Surgery

## 2020-04-25 ENCOUNTER — Ambulatory Visit (HOSPITAL_BASED_OUTPATIENT_CLINIC_OR_DEPARTMENT_OTHER)
Admission: RE | Admit: 2020-04-25 | Discharge: 2020-04-25 | Disposition: A | Discharge status: Home / Self Care | Payer: BLUE CROSS/BLUE SHIELD | Attending: Orthopaedic Surgery | Admitting: Orthopaedic Surgery

## 2020-04-25 ENCOUNTER — Ambulatory Visit (HOSPITAL_BASED_OUTPATIENT_CLINIC_OR_DEPARTMENT_OTHER): Payer: BLUE CROSS/BLUE SHIELD | Admitting: Anesthesiology

## 2020-04-25 DIAGNOSIS — K219 Gastro-esophageal reflux disease without esophagitis: Secondary | ICD-10-CM | POA: Insufficient documentation

## 2020-04-25 DIAGNOSIS — K589 Irritable bowel syndrome without diarrhea: Secondary | ICD-10-CM | POA: Diagnosis not present

## 2020-04-25 DIAGNOSIS — Z20822 Contact with and (suspected) exposure to covid-19: Secondary | ICD-10-CM | POA: Diagnosis not present

## 2020-04-25 DIAGNOSIS — M24611 Ankylosis, right shoulder: Secondary | ICD-10-CM | POA: Diagnosis not present

## 2020-04-25 DIAGNOSIS — Z79899 Other long term (current) drug therapy: Secondary | ICD-10-CM | POA: Diagnosis not present

## 2020-04-25 DIAGNOSIS — M7501 Adhesive capsulitis of right shoulder: Secondary | ICD-10-CM | POA: Diagnosis not present

## 2020-04-25 DIAGNOSIS — I498 Other specified cardiac arrhythmias: Secondary | ICD-10-CM | POA: Insufficient documentation

## 2020-04-25 DIAGNOSIS — G8918 Other acute postprocedural pain: Secondary | ICD-10-CM | POA: Diagnosis not present

## 2020-04-25 HISTORY — PX: SHOULDER ARTHROSCOPY WITH DISTAL CLAVICLE RESECTION: SHX5675

## 2020-04-25 LAB — POCT PREGNANCY, URINE: Preg Test, Ur: NEGATIVE

## 2020-04-25 SURGERY — SHOULDER ARTHROSCOPY WITH DISTAL CLAVICLE RESECTION
Anesthesia: General | Site: Shoulder | Laterality: Right

## 2020-04-25 MED ORDER — MIDAZOLAM HCL 2 MG/2ML IJ SOLN
2.0000 mg | Freq: Once | INTRAMUSCULAR | Status: AC
  Administered 2020-04-25: 2 mg via INTRAVENOUS

## 2020-04-25 MED ORDER — MEPERIDINE HCL 25 MG/ML IJ SOLN: 6.2500 mg | INTRAMUSCULAR | Status: DC | PRN

## 2020-04-25 MED ORDER — EPINEPHRINE PF 1 MG/ML IJ SOLN
INTRAMUSCULAR | Status: AC
  Filled 2020-04-25: qty 2

## 2020-04-25 MED ORDER — SCOPOLAMINE 1 MG/3DAYS TD PT72
MEDICATED_PATCH | TRANSDERMAL | Status: DC | PRN
  Administered 2020-04-25: 1 via TRANSDERMAL

## 2020-04-25 MED ORDER — BUPIVACAINE HCL (PF) 0.25 % IJ SOLN
INTRAMUSCULAR | Status: AC
  Filled 2020-04-25: qty 60

## 2020-04-25 MED ORDER — PROPOFOL 10 MG/ML IV BOLUS
INTRAVENOUS | Status: AC
  Filled 2020-04-25: qty 20

## 2020-04-25 MED ORDER — FENTANYL CITRATE (PF) 100 MCG/2ML IJ SOLN
100.0000 ug | Freq: Once | INTRAMUSCULAR | Status: AC
  Administered 2020-04-25: 100 ug via INTRAVENOUS

## 2020-04-25 MED ORDER — MELOXICAM 15 MG PO TABS: 15.0000 mg | ORAL_TABLET | Freq: Every day | ORAL | 0 refills | Status: AC

## 2020-04-25 MED ORDER — TRAMADOL HCL 50 MG PO TABS
50.0000 mg | ORAL_TABLET | Freq: Three times a day (TID) | ORAL | 0 refills | Status: DC | PRN
Start: 2020-04-25 — End: 2020-11-07

## 2020-04-25 MED ORDER — PHENYLEPHRINE HCL (PRESSORS) 10 MG/ML IV SOLN
INTRAVENOUS | Status: DC | PRN
  Administered 2020-04-25 (×2): 80 ug via INTRAVENOUS

## 2020-04-25 MED ORDER — PHENYLEPHRINE 40 MCG/ML (10ML) SYRINGE FOR IV PUSH (FOR BLOOD PRESSURE SUPPORT)
PREFILLED_SYRINGE | INTRAVENOUS | Status: AC
  Filled 2020-04-25: qty 10

## 2020-04-25 MED ORDER — ONDANSETRON HCL 4 MG PO TABS: 4.0000 mg | ORAL_TABLET | Freq: Three times a day (TID) | ORAL | 1 refills | Status: AC | PRN

## 2020-04-25 MED ORDER — ROCURONIUM BROMIDE 100 MG/10ML IV SOLN
INTRAVENOUS | Status: DC | PRN
  Administered 2020-04-25: 70 mg via INTRAVENOUS

## 2020-04-25 MED ORDER — LACTATED RINGERS IV SOLN: INTRAVENOUS | Status: DC

## 2020-04-25 MED ORDER — ONDANSETRON HCL 4 MG/2ML IJ SOLN
INTRAMUSCULAR | Status: AC
  Filled 2020-04-25: qty 2

## 2020-04-25 MED ORDER — DEXAMETHASONE SODIUM PHOSPHATE 4 MG/ML IJ SOLN
INTRAMUSCULAR | Status: DC | PRN
  Administered 2020-04-25: 10 mg via INTRAVENOUS

## 2020-04-25 MED ORDER — FENTANYL CITRATE (PF) 100 MCG/2ML IJ SOLN
INTRAMUSCULAR | Status: DC | PRN
  Administered 2020-04-25 (×2): 50 ug via INTRAVENOUS

## 2020-04-25 MED ORDER — DIPHENHYDRAMINE HCL 50 MG/ML IJ SOLN
INTRAMUSCULAR | Status: DC | PRN
  Administered 2020-04-25: 6.25 mg via INTRAVENOUS

## 2020-04-25 MED ORDER — CEFAZOLIN SODIUM-DEXTROSE 2-4 GM/100ML-% IV SOLN
INTRAVENOUS | Status: AC
  Filled 2020-04-25: qty 100

## 2020-04-25 MED ORDER — ONDANSETRON HCL 4 MG/2ML IJ SOLN: 4.0000 mg | Freq: Once | INTRAMUSCULAR | Status: DC | PRN

## 2020-04-25 MED ORDER — SCOPOLAMINE 1 MG/3DAYS TD PT72
MEDICATED_PATCH | TRANSDERMAL | Status: AC
  Filled 2020-04-25: qty 1

## 2020-04-25 MED ORDER — BUPIVACAINE LIPOSOME 1.3 % IJ SUSP
INTRAMUSCULAR | Status: DC | PRN
  Administered 2020-04-25: 10 mL via PERINEURAL

## 2020-04-25 MED ORDER — FENTANYL CITRATE (PF) 100 MCG/2ML IJ SOLN
INTRAMUSCULAR | Status: AC
  Filled 2020-04-25: qty 2

## 2020-04-25 MED ORDER — ONDANSETRON HCL 4 MG/2ML IJ SOLN
INTRAMUSCULAR | Status: DC | PRN
  Administered 2020-04-25: 4 mg via INTRAVENOUS

## 2020-04-25 MED ORDER — CEFAZOLIN SODIUM-DEXTROSE 2-4 GM/100ML-% IV SOLN
2.0000 g | INTRAVENOUS | Status: AC
  Administered 2020-04-25: 2 g via INTRAVENOUS

## 2020-04-25 MED ORDER — BUPIVACAINE-EPINEPHRINE (PF) 0.5% -1:200000 IJ SOLN
INTRAMUSCULAR | Status: DC | PRN
  Administered 2020-04-25: 15 mL via PERINEURAL

## 2020-04-25 MED ORDER — PROPOFOL 500 MG/50ML IV EMUL
INTRAVENOUS | Status: DC | PRN
  Administered 2020-04-25: 150 ug/kg/min via INTRAVENOUS

## 2020-04-25 MED ORDER — MIDAZOLAM HCL 2 MG/2ML IJ SOLN
INTRAMUSCULAR | Status: AC
  Filled 2020-04-25: qty 2

## 2020-04-25 MED ORDER — PROPOFOL 10 MG/ML IV BOLUS
INTRAVENOUS | Status: DC | PRN
  Administered 2020-04-25: 140 mg via INTRAVENOUS
  Administered 2020-04-25: 30 mg via INTRAVENOUS

## 2020-04-25 MED ORDER — ACETAMINOPHEN 500 MG PO TABS: 1000.0000 mg | ORAL_TABLET | Freq: Three times a day (TID) | ORAL | 0 refills | Status: AC

## 2020-04-25 MED ORDER — HYDROMORPHONE HCL 1 MG/ML IJ SOLN: 0.2500 mg | INTRAMUSCULAR | Status: DC | PRN

## 2020-04-25 MED ORDER — SUGAMMADEX SODIUM 200 MG/2ML IV SOLN
INTRAVENOUS | Status: DC | PRN
  Administered 2020-04-25: 200 mg via INTRAVENOUS

## 2020-04-25 SURGICAL SUPPLY — 57 items
AID PSTN UNV HD RSTRNT DISP (MISCELLANEOUS) ×1
APL PRP STRL LF DISP 70% ISPRP (MISCELLANEOUS) ×1
BLADE EXCALIBUR 4.0X13 (MISCELLANEOUS) ×2 IMPLANT
BLADE SURG 10 STRL SS (BLADE) IMPLANT
BURR OVAL 8 FLU 4.0X13 (MISCELLANEOUS) IMPLANT
CANNULA 5.75X71 LONG (CANNULA) IMPLANT
CANNULA PASSPORT 5 (CANNULA) IMPLANT
CANNULA PASSPORT BUTTON 10-40 (CANNULA) IMPLANT
CANNULA TWIST IN 8.25X7CM (CANNULA) IMPLANT
CHLORAPREP W/TINT 26 (MISCELLANEOUS) ×2 IMPLANT
CLSR STERI-STRIP ANTIMIC 1/2X4 (GAUZE/BANDAGES/DRESSINGS) ×2 IMPLANT
COOLER ICEMAN CLASSIC (MISCELLANEOUS) ×2 IMPLANT
COVER WAND RF STERILE (DRAPES) IMPLANT
DRAPE IMP U-DRAPE 54X76 (DRAPES) ×2 IMPLANT
DRAPE INCISE IOBAN 66X45 STRL (DRAPES) IMPLANT
DRAPE SHOULDER BEACH CHAIR (DRAPES) ×2 IMPLANT
DRSG PAD ABDOMINAL 8X10 ST (GAUZE/BANDAGES/DRESSINGS) ×2 IMPLANT
DW OUTFLOW CASSETTE/TUBE SET (MISCELLANEOUS) ×2 IMPLANT
GAUZE SPONGE 4X4 12PLY STRL (GAUZE/BANDAGES/DRESSINGS) ×2 IMPLANT
GLOVE BIO SURGEON STRL SZ 6.5 (GLOVE) ×2 IMPLANT
GLOVE BIOGEL PI IND STRL 6.5 (GLOVE) ×2 IMPLANT
GLOVE BIOGEL PI IND STRL 8 (GLOVE) ×1 IMPLANT
GLOVE BIOGEL PI INDICATOR 6.5 (GLOVE) ×2
GLOVE BIOGEL PI INDICATOR 8 (GLOVE) ×1
GLOVE ECLIPSE 6.5 STRL STRAW (GLOVE) ×6 IMPLANT
GLOVE ECLIPSE 8.0 STRL XLNG CF (GLOVE) ×2 IMPLANT
GOWN STRL REUS W/ TWL LRG LVL3 (GOWN DISPOSABLE) ×2 IMPLANT
GOWN STRL REUS W/TWL LRG LVL3 (GOWN DISPOSABLE) ×4
GOWN STRL REUS W/TWL XL LVL3 (GOWN DISPOSABLE) ×2 IMPLANT
KIT STABILIZATION SHOULDER (MISCELLANEOUS) ×2 IMPLANT
KIT STR SPEAR 1.8 FBRTK DISP (KITS) IMPLANT
LASSO CRESCENT QUICKPASS (SUTURE) IMPLANT
MANIFOLD NEPTUNE II (INSTRUMENTS) ×2 IMPLANT
NDL SAFETY ECLIPSE 18X1.5 (NEEDLE) ×1 IMPLANT
NEEDLE HYPO 18GX1.5 SHARP (NEEDLE) ×2
NEEDLE SCORPION MULTI FIRE (NEEDLE) IMPLANT
PACK ARTHROSCOPY DSU (CUSTOM PROCEDURE TRAY) ×2 IMPLANT
PACK BASIN DAY SURGERY FS (CUSTOM PROCEDURE TRAY) ×2 IMPLANT
PAD COLD SHLDR WRAP-ON (PAD) ×2 IMPLANT
PORT APPOLLO RF 90DEGREE MULTI (SURGICAL WAND) ×2 IMPLANT
RESTRAINT HEAD UNIVERSAL NS (MISCELLANEOUS) ×2 IMPLANT
SHEET MEDIUM DRAPE 40X70 STRL (DRAPES) IMPLANT
SLEEVE SCD COMPRESS KNEE MED (MISCELLANEOUS) ×2 IMPLANT
SLING ARM FOAM STRAP LRG (SOFTGOODS) IMPLANT
SUT FIBERWIRE #2 38 T-5 BLUE (SUTURE)
SUT MNCRL AB 4-0 PS2 18 (SUTURE) ×2 IMPLANT
SUT PDS AB 1 CT  36 (SUTURE)
SUT PDS AB 1 CT 36 (SUTURE) IMPLANT
SUT TIGER TAPE 7 IN WHITE (SUTURE) IMPLANT
SUTURE FIBERWR #2 38 T-5 BLUE (SUTURE) IMPLANT
SUTURE TAPE TIGERLINK 1.3MM BL (SUTURE) IMPLANT
SUTURETAPE TIGERLINK 1.3MM BL (SUTURE)
SYR 5ML LL (SYRINGE) ×2 IMPLANT
TAPE FIBER 2MM 7IN #2 BLUE (SUTURE) IMPLANT
TOWEL GREEN STERILE FF (TOWEL DISPOSABLE) ×4 IMPLANT
TUBE CONNECTING 20X1/4 (TUBING) ×2 IMPLANT
TUBING ARTHROSCOPY IRRIG 16FT (MISCELLANEOUS) ×2 IMPLANT

## 2020-04-25 NOTE — Anesthesia Procedure Notes (Signed)
Anesthesia Regional Block: Interscalene brachial plexus block   Pre-Anesthetic Checklist: ,, timeout performed, Correct Patient, Correct Site, Correct Laterality, Correct Procedure, Correct Position, site marked, Risks and benefits discussed,  Surgical consent,  Pre-op evaluation,  At surgeon's request and post-op pain management  Laterality: Right  Prep: chloraprep       Needles:  Injection technique: Single-shot  Needle Type: Other     Needle Length: 9cm  Needle Gauge: 21   Needle insertion depth: 5 cm   Additional Needles:   Procedures:, nerve stimulator,,,,,,,  Narrative:  Start time: 04/25/2020 11:52 AM End time: 04/25/2020 12:02 PM Injection made incrementally with aspirations every 5 mL.  Performed by: Personally  Anesthesiologist: Arta Bruce, MD  Additional Notes: Monitors applied. Patient sedated. Sterile prep and drape,hand hygiene and sterile gloves were used. Needle position confirmed with evoked response at 0.4 mV.Local anesthetic injected incrementally after negative aspiration.Vascular puncture avoided. No complications. The patient tolerated the procedure well.

## 2020-04-25 NOTE — Discharge Instructions (Signed)
Regional Anesthesia Blocks ° °1. Numbness or the inability to move the "blocked" extremity may last from 3-48 hours after placement. The length of time depends on the medication injected and your individual response to the medication. If the numbness is not going away after 48 hours, call your surgeon. ° °2. The extremity that is blocked will need to be protected until the numbness is gone and the  Strength has returned. Because you cannot feel it, you will need to take extra care to avoid injury. Because it may be weak, you may have difficulty moving it or using it. You may not know what position it is in without looking at it while the block is in effect. ° °3. For blocks in the legs and feet, returning to weight bearing and walking needs to be done carefully. You will need to wait until the numbness is entirely gone and the strength has returned. You should be able to move your leg and foot normally before you try and bear weight or walk. You will need someone to be with you when you first try to ensure you do not fall and possibly risk injury. ° °4. Bruising and tenderness at the needle site are common side effects and will resolve in a few days. ° °5. Persistent numbness or new problems with movement should be communicated to the surgeon or the [facility] ([phone])/ [facility] ([phone]). ° ° °Information for Discharge Teaching: °EXPAREL (bupivacaine liposome injectable suspension)  ° °Your surgeon or anesthesiologist gave you EXPAREL(bupivacaine) to help control your pain after surgery.  °· EXPAREL is a local anesthetic that provides pain relief by numbing the tissue around the surgical site. °· EXPAREL is designed to release pain medication over time and can control pain for up to 72 hours. °· Depending on how you respond to EXPAREL, you may require less pain medication during your recovery. ° °Possible side effects: °· Temporary loss of sensation or ability to move in the  area where bupivacaine was injected. °· Nausea, vomiting, constipation °· Rarely, numbness and tingling in your mouth or lips, lightheadedness, or anxiety may occur. °· Call your doctor right away if you think you may be experiencing any of these sensations, or if you have other questions regarding possible side effects. ° °Follow all other discharge instructions given to you by your surgeon or nurse. Eat a healthy diet and drink plenty of water or other fluids. ° °If you return to the hospital for any reason within 96 hours following the administration of EXPAREL, it is important for health care providers to know that you have received this anesthetic. A teal colored band has been placed on your arm with the date, time and amount of EXPAREL you have received in order to alert and inform your health care providers. Please leave this armband in place for the full 96 hours following administration, and then you may remove the band. ° ° ° °Post Anesthesia Home Care Instructions ° °Activity: °Get plenty of rest for the remainder of the day. A responsible individual must stay with you for 24 hours following the procedure.  °For the next 24 hours, DO NOT: °-Drive a car °-Operate machinery °-Drink alcoholic beverages °-Take any medication unless instructed by your physician °-Make any legal decisions or sign important papers. ° °Meals: °Start with liquid foods such as gelatin or soup. Progress to regular foods as tolerated. Avoid greasy, spicy, heavy foods. If nausea and/or vomiting occur, drink only clear liquids   until the nausea and/or vomiting subsides. Call your physician if vomiting continues. ° °Special Instructions/Symptoms: °Your throat may feel dry or sore from the anesthesia or the breathing tube placed in your throat during surgery. If this causes discomfort, gargle with warm salt water. The discomfort should disappear within 24 hours. ° °If you had a scopolamine patch placed behind your ear for the management  of post- operative nausea and/or vomiting: ° °1. The medication in the patch is effective for 72 hours, after which it should be removed.  Wrap patch in a tissue and discard in the trash. Wash hands thoroughly with soap and water. °2. You may remove the patch earlier than 72 hours if you experience unpleasant side effects which may include dry mouth, dizziness or visual disturbances. °3. Avoid touching the patch. Wash your hands with soap and water after contact with the patch. °   ° °

## 2020-04-25 NOTE — Anesthesia Postprocedure Evaluation (Signed)
Anesthesia Post Note  Patient: Carla Griffin  Procedure(s) Performed: ARTHROSCOPY RIGHT  SHOULDER WITH LYSIS RESECT ADHESIONS WITH MANIPULATION (Right Shoulder)     Patient location during evaluation: PACU Anesthesia Type: General Level of consciousness: awake and alert Pain management: pain level controlled Vital Signs Assessment: post-procedure vital signs reviewed and stable Respiratory status: spontaneous breathing, nonlabored ventilation, respiratory function stable and patient connected to nasal cannula oxygen Cardiovascular status: blood pressure returned to baseline and stable Postop Assessment: no apparent nausea or vomiting Anesthetic complications: no   No complications documented.  Last Vitals:  Vitals:   04/25/20 1330 04/25/20 1335  BP: 117/71   Pulse: 96   Resp: 17   Temp:    SpO2: 100% 98%    Last Pain:  Vitals:   04/25/20 1319  TempSrc:   PainSc: 0-No pain                 Octa Uplinger DAVID

## 2020-04-25 NOTE — Anesthesia Procedure Notes (Signed)
Procedure Name: Intubation Date/Time: 04/25/2020 12:31 PM Performed by: Thornell Mule, CRNA Pre-anesthesia Checklist: Patient identified, Emergency Drugs available, Suction available and Patient being monitored Patient Re-evaluated:Patient Re-evaluated prior to induction Oxygen Delivery Method: Circle system utilized Preoxygenation: Pre-oxygenation with 100% oxygen Induction Type: IV induction Ventilation: Mask ventilation without difficulty Laryngoscope Size: Miller and 3 Grade View: Grade I Tube type: Oral Tube size: 7.0 mm Number of attempts: 1 Airway Equipment and Method: Stylet and Oral airway Placement Confirmation: ETT inserted through vocal cords under direct vision,  positive ETCO2 and breath sounds checked- equal and bilateral Secured at: 20 cm Tube secured with: Tape Dental Injury: Teeth and Oropharynx as per pre-operative assessment

## 2020-04-25 NOTE — Interval H&P Note (Signed)
History and Physical Interval Note:  04/25/2020 10:56 AM  Carla Griffin  has presented today for surgery, with the diagnosis of ADHESIVE CAPSULITIS OF RIGHT SHOULDER.  The various methods of treatment have been discussed with the patient and family. After consideration of risks, benefits and other options for treatment, the patient has consented to  Procedure(s): ARTHROSCOPY RIGHT  SHOULDER WITH LYSIS RESECT ADHESIONS WITH MANIPULATION (Right) as a surgical intervention.  The patient's history has been reviewed, patient examined, no change in status, stable for surgery.  I have reviewed the patient's chart and labs.  Questions were answered to the patient's satisfaction.     Bjorn Pippin

## 2020-04-25 NOTE — Progress Notes (Signed)
Assisted Dr. Ossey with right, ultrasound guided, interscalene  block. Side rails up, monitors on throughout procedure. See vital signs in flow sheet. Tolerated Procedure well. 

## 2020-04-25 NOTE — Op Note (Signed)
Orthopaedic Surgery Operative Note (CSN: [id number])  Carla Griffin  Aug 27, 1980 Date of Surgery: 04/25/2020   Diagnoses:  Arthrofibrosis of the right shoulder  Procedure: Arthroscopic lysis of adhesions and manipulation under anesthesia   Operative Finding Exam under anesthesia: Preoperatively the patient had about 90 degrees of glenohumeral motion in forward flexion about 10 degrees of external rotation.  Postoperatively we had 107 degrees of forward flexion and ex rotation at 90 degrees. Articular space: Significant anterior interval adhesions no loose bodies Chondral surfaces: As well-documented in previous notes she had significant grade 4 change in the superior third of the glenoid as well as the central portion of the humeral head.  This was complete cartilage loss that she had some fibrocartilage that was overlaid over this area compared to a previous surgery.  No new loose bodies. Biceps: Tenotomy and tenodesis performed last surgery Subscapularis: Intact Superior Cuff: Intact but red and inflamed Bursal side: subdeltoid adhesions.  Successful completion of the planned procedure.  Patient's significant arthrofibrosis and this was resected and the patient had a gentle manipulation which provided full range of motion.  She will start therapy tomorrow.   Post-operative plan: The patient will be non-weightbearing in a sling till her block wears off.  The patient will be discharged home.  DVT prophylaxis not indicated in ambulatory upper extremity patient without known risk factors.   Pain control with PRN pain medication preferring oral medicines.  Follow up plan will be scheduled in approximately 7 days for incision check.  Post-Op Diagnosis: Same Surgeons:Primary: Bjorn Pippin, MD Assistants:Caroline McBane PA-C Location: [facility] OR ROOM 6 Anesthesia: General with Exparel interscalene block Antibiotics: Ancef 2 g Tourniquet time: None Estimated Blood Loss: Minimal Complications:  None Specimens: None Implants: * No implants in log *  Indications for Surgery:   Carla Griffin is a 39 y.o. female with continued shoulder pain refractory to nonoperative measures for extended period of time.  She had arthrofibrosis after her previous surgery and significant stiffness.  The risks and benefits were explained at length including but not limited to continued pain, cuff failure, biceps tenodesis failure, stiffness, need for further surgery and infection.   Procedure:   Patient was correctly identified in the preoperative holding area and operative site marked.  Patient brought to OR and positioned beachchair on an Onaway table ensuring that all bony prominences were padded and the head was in an appropriate location.  Anesthesia was induced and the operative shoulder was prepped and draped in the usual sterile fashion.  Timeout was called preincision.  A standard posterior viewing portal was made after localizing the portal with a spinal needle.  An anterior accessory portal was also made.  After clearing the articular space the camera was positioned in the subacromial space.  Findings above.    We identified significant capsular adhesions of anterior interval we realized he is doing the skeletonization of the coracoid.  Subscapularis was protected.  We then did a careful lysis of adhesions and capsular release of the inferior half of the capsule.  At that point we were satisfied with our resection and performed a gentle manipulation under anesthesia achieving full motion as above.  We entered the subacromial space and cleared this releasing all adhesions.  The incisions were closed with absorbable monocryl and steri strips.  A sterile dressing was placed along with a sling. The patient was awoken from general anesthesia and taken to the PACU in stable condition without complication.   Alfonse Alpers, PA-C,  present and scrubbed throughout the case, critical for completion in a timely  fashion, and for retraction, instrumentation, closure.

## 2020-04-25 NOTE — Anesthesia Preprocedure Evaluation (Signed)
Anesthesia Evaluation  Patient identified by MRN, date of birth, ID band Patient awake    Reviewed: Allergy & Precautions, NPO status , Patient's Chart, lab work & pertinent test results  History of Anesthesia Complications (+) PONV  Airway Mallampati: I  TM Distance: >3 FB Neck ROM: Full    Dental   Pulmonary    Pulmonary exam normal        Cardiovascular Normal cardiovascular exam     Neuro/Psych    GI/Hepatic GERD  Medicated and Controlled,  Endo/Other    Renal/GU      Musculoskeletal   Abdominal   Peds  Hematology   Anesthesia Other Findings   Reproductive/Obstetrics                             Anesthesia Physical Anesthesia Plan  ASA: II  Anesthesia Plan: General   Post-op Pain Management:  Regional for Post-op pain   Induction: Intravenous  PONV Risk Score and Plan: 4 or greater and Ondansetron, Dexamethasone, Midazolam, Scopolamine patch - Pre-op and Diphenhydramine  Airway Management Planned: Oral ETT  Additional Equipment:   Intra-op Plan:   Post-operative Plan: Extubation in OR  Informed Consent: I have reviewed the patients History and Physical, chart, labs and discussed the procedure including the risks, benefits and alternatives for the proposed anesthesia with the patient or authorized representative who has indicated his/her understanding and acceptance.       Plan Discussed with: CRNA and Surgeon  Anesthesia Plan Comments:         Anesthesia Quick Evaluation

## 2020-04-25 NOTE — Transfer of Care (Signed)
Immediate Anesthesia Transfer of Care Note  Patient: Carla Griffin  Procedure(s) Performed: ARTHROSCOPY RIGHT  SHOULDER WITH LYSIS RESECT ADHESIONS WITH MANIPULATION (Right Shoulder)  Patient Location: PACU  Anesthesia Type:General  Level of Consciousness: drowsy, patient cooperative and responds to stimulation  Airway & Oxygen Therapy: Patient Spontanous Breathing and Patient connected to face mask oxygen  Post-op Assessment: Report given to RN and Post -op Vital signs reviewed and stable  Post vital signs: Reviewed and stable  Last Vitals:  Vitals Value Taken Time  BP    Temp    Pulse    Resp 21 04/25/20 1319  SpO2    Vitals shown include unvalidated device data.  Last Pain:  Vitals:   04/25/20 1045  TempSrc: Oral  PainSc: 0-No pain      Patients Stated Pain Goal: 1 (04/25/20 1045)  Complications: No complications documented.

## 2020-04-26 ENCOUNTER — Encounter (HOSPITAL_BASED_OUTPATIENT_CLINIC_OR_DEPARTMENT_OTHER): Payer: Self-pay | Admitting: Orthopaedic Surgery

## 2020-04-26 DIAGNOSIS — M25611 Stiffness of right shoulder, not elsewhere classified: Secondary | ICD-10-CM | POA: Diagnosis not present

## 2020-04-26 DIAGNOSIS — M24111 Other articular cartilage disorders, right shoulder: Secondary | ICD-10-CM | POA: Diagnosis not present

## 2020-04-26 DIAGNOSIS — M25511 Pain in right shoulder: Secondary | ICD-10-CM | POA: Diagnosis not present

## 2020-04-26 DIAGNOSIS — M6281 Muscle weakness (generalized): Secondary | ICD-10-CM | POA: Diagnosis not present

## 2020-04-30 DIAGNOSIS — M6281 Muscle weakness (generalized): Secondary | ICD-10-CM | POA: Diagnosis not present

## 2020-04-30 DIAGNOSIS — M25611 Stiffness of right shoulder, not elsewhere classified: Secondary | ICD-10-CM | POA: Diagnosis not present

## 2020-04-30 DIAGNOSIS — M24111 Other articular cartilage disorders, right shoulder: Secondary | ICD-10-CM | POA: Diagnosis not present

## 2020-04-30 DIAGNOSIS — M25511 Pain in right shoulder: Secondary | ICD-10-CM | POA: Diagnosis not present

## 2020-05-01 DIAGNOSIS — M25611 Stiffness of right shoulder, not elsewhere classified: Secondary | ICD-10-CM | POA: Diagnosis not present

## 2020-05-01 DIAGNOSIS — M25511 Pain in right shoulder: Secondary | ICD-10-CM | POA: Diagnosis not present

## 2020-05-01 DIAGNOSIS — M24111 Other articular cartilage disorders, right shoulder: Secondary | ICD-10-CM | POA: Diagnosis not present

## 2020-05-01 DIAGNOSIS — M6281 Muscle weakness (generalized): Secondary | ICD-10-CM | POA: Diagnosis not present

## 2020-05-02 DIAGNOSIS — M24111 Other articular cartilage disorders, right shoulder: Secondary | ICD-10-CM | POA: Diagnosis not present

## 2020-05-03 DIAGNOSIS — M6281 Muscle weakness (generalized): Secondary | ICD-10-CM | POA: Diagnosis not present

## 2020-05-03 DIAGNOSIS — M25511 Pain in right shoulder: Secondary | ICD-10-CM | POA: Diagnosis not present

## 2020-05-03 DIAGNOSIS — M24111 Other articular cartilage disorders, right shoulder: Secondary | ICD-10-CM | POA: Diagnosis not present

## 2020-05-03 DIAGNOSIS — M25611 Stiffness of right shoulder, not elsewhere classified: Secondary | ICD-10-CM | POA: Diagnosis not present

## 2020-05-09 DIAGNOSIS — M25611 Stiffness of right shoulder, not elsewhere classified: Secondary | ICD-10-CM | POA: Diagnosis not present

## 2020-05-09 DIAGNOSIS — M25511 Pain in right shoulder: Secondary | ICD-10-CM | POA: Diagnosis not present

## 2020-05-09 DIAGNOSIS — M6281 Muscle weakness (generalized): Secondary | ICD-10-CM | POA: Diagnosis not present

## 2020-05-09 DIAGNOSIS — M24111 Other articular cartilage disorders, right shoulder: Secondary | ICD-10-CM | POA: Diagnosis not present

## 2020-06-13 ENCOUNTER — Other Ambulatory Visit: Payer: Self-pay | Admitting: Cardiology

## 2020-11-07 ENCOUNTER — Other Ambulatory Visit: Payer: Self-pay

## 2020-11-07 ENCOUNTER — Encounter: Payer: Self-pay | Admitting: Cardiology

## 2020-11-07 ENCOUNTER — Ambulatory Visit (INDEPENDENT_AMBULATORY_CARE_PROVIDER_SITE_OTHER): Payer: 59 | Admitting: Cardiology

## 2020-11-07 VITALS — BP 148/78 | HR 98 | Ht 66.0 in | Wt 155.0 lb

## 2020-11-07 DIAGNOSIS — G909 Disorder of the autonomic nervous system, unspecified: Secondary | ICD-10-CM

## 2020-11-07 NOTE — Patient Instructions (Signed)
Medication Instructions:  °Your physician recommends that you continue on your current medications as directed. Please refer to the Current Medication list given to you today. ° °*If you need a refill on your cardiac medications before your next appointment, please call your pharmacy* ° ° °Lab Work: °None ordered ° ° °Testing/Procedures: °None ordered ° ° °Follow-Up: °At [facility], you and your health needs are our priority.  As part of our continuing mission to provide you with exceptional heart care, we have created designated Provider Care Teams.  These Care Teams include your primary Cardiologist (physician) and Advanced Practice Providers (APPs -  Physician Assistants and Nurse Practitioners) who all work together to provide you with the care you need, when you need it. ° °Your next appointment:   °1 year(s) ° °The format for your next appointment:   °In Person ° °Provider:   °Will Camnitz, MD ° ° ° °Thank you for choosing [facility]!! ° ° °Haniya Fern, RN °[phone] ° ° ° °

## 2020-11-07 NOTE — Progress Notes (Signed)
Electrophysiology Office Note   Date:  11/07/2020   ID:  Carla Griffin, Carla Griffin 1981/03/07, MRN [id number]  PCP:  Carla Griffin  Cardiologist:  Anne Fu Primary Electrophysiologist:  Carla Griffin Carla Loa, MD    No chief complaint on file.    History of Present Illness: Carla Griffin is a 40 y.o. female who is being seen today for the evaluation of PVCs at the request of Carla Nielsen, DO. Presenting today for electrophysiology evaluation.   She is a Engineer, civil (consulting) at [employer].  She has a history of GERD, gallstones status postcholecystectomy, IBS, and PVCs.  She had an episode of syncope.  She is saw her primary physician July 2018 with her episode of syncope and diagnosis of POTS.  She had orthostatics with elevated heart rates and reproduction of her milder symptoms with postural changes.  Beginning of 2018 starting at her job in the morning, she stood up to give report when she suddenly felt hot, nauseous, weak, and nearly passed out.  Blood pressure was 70s over 40s.  Symptoms occurred when she sat down.  She had more lightheadedness on standing.  She is been increasing her fluid and salt intake.  She presented to hospital 06/21/2017 with complaints of palpitations.  She was found to be in sinus tachycardia.  She was given IV fluids with improvement in her heart rate down to the 70s.  She was discharged from the emergency room.  Today, denies symptoms of palpitations, chest pain, shortness of breath, orthopnea, PND, lower extremity edema, claudication, dizziness, presyncope, syncope, bleeding, or neurologic sequela. The patient is tolerating medications without difficulties.  Since last being seen she has overall done well.  Unfortunately she had shoulder surgery x2 to for a torn labrum and cartilage issues.  She developed a frozen shoulder which was the reason for her second operation.  Last weekend, she had an episode of chest discomfort and nausea.  She vomited and felt much better.  She does  have GI issues and she attributes this to indigestion.  Aside from that, she has done well and is without major complaint.   Past Medical History:  Diagnosis Date   Gallstones    GERD (gastroesophageal reflux disease)    IBS (irritable bowel syndrome)    Pneumonia    PONV (postoperative nausea and vomiting)    POTS (postural orthostatic tachycardia syndrome)    Urinary tract infection    Past Surgical History:  Procedure Laterality Date   BICEPT TENODESIS Right 12/21/2019   Procedure: BICEPS TENODESIS;  Surgeon: Bjorn Pippin, MD;  Location: [facility];  Service: Orthopedics;  Laterality: Right;   BRAVO PH STUDY N/A 03/03/2016   Procedure: BRAVO PH STUDY;  Surgeon: Napoleon Form, MD;  Location: [facility];  Service: Endoscopy;  Laterality: N/A;   CHOLECYSTECTOMY  2014   ESOPHAGOGASTRODUODENOSCOPY (EGD) WITH PROPOFOL N/A 03/03/2016   Procedure: ESOPHAGOGASTRODUODENOSCOPY (EGD) WITH PROPOFOL;  Surgeon: Napoleon Form, MD;  Location: [facility];  Service: Endoscopy;  Laterality: N/A;   SHOULDER ARTHROSCOPY WITH DISTAL CLAVICLE RESECTION Right 04/25/2020   Procedure: ARTHROSCOPY RIGHT  SHOULDER WITH LYSIS RESECT ADHESIONS WITH MANIPULATION;  Surgeon: Bjorn Pippin, MD;  Location: [facility];  Service: Orthopedics;  Laterality: Right;   WISDOM TOOTH EXTRACTION       Current Outpatient Medications  Medication Sig Dispense Refill   diclofenac Sodium (VOLTAREN) 1 % GEL APPLY TOPICALLY 4 TIMES A DAY AS NEEDED.     diltiazem (CARDIZEM  CD) 240 MG 24 hr capsule TAKE 1 CAPSULE BY MOUTH EVERY DAY. Pt needs to make appt with provider for further refills 90 capsule 1   esomeprazole (NEXIUM) 40 MG capsule Take 1 capsule (40 mg total) by mouth 2 (two) times daily before a meal. 180 capsule 3   Multiple Vitamin (MULTIVITAMIN) tablet Take 1 tablet by mouth daily.     norethindrone-ethinyl estradiol (JUNEL FE 1/20) 1-20 MG-MCG tablet Take 1 tablet by mouth daily.  84 tablet 11   Probiotic Product (ALIGN) 4 MG CAPS Take 4 mg by mouth daily before breakfast.      No current facility-administered medications for this visit.    Allergies:   Patient has no known allergies.   Social History:  The patient  reports that she has never smoked. She has never used smokeless tobacco. She reports previous alcohol use. She reports that she does not use drugs.   Family History:  The patient's family history includes Diabetes in her maternal grandfather; Heart attack in her maternal grandfather; Hyperlipidemia in her father; Hypertension in her father; Lupus in her mother; Stroke in her paternal grandfather.   ROS:  Please see the history of present illness.   Otherwise, review of systems is positive for none.   All other systems are reviewed and negative.   PHYSICAL EXAM: VS:  BP (!) 148/78   Pulse 98   Ht 5\' 6"  (1.676 m)   Wt 155 lb (70.3 kg)   SpO2 98%   BMI 25.02 kg/m  , BMI Body mass index is 25.02 kg/m. GEN: Well nourished, well developed, in no acute distress  HEENT: normal  Neck: no JVD, carotid bruits, or masses Cardiac: RRR; no murmurs, rubs, or gallops,no edema  Respiratory:  clear to auscultation bilaterally, normal work of breathing GI: soft, nontender, nondistended, + BS MS: no deformity or atrophy  Skin: warm and dry Neuro:  Strength and sensation are intact Psych: euthymic mood, full affect  EKG:  EKG is ordered today. Personal review of the ekg ordered shows sinus rhythm, rate 98  Recent Labs: No results found for requested labs within last 8760 hours.    Lipid Panel     Component Value Date/Time   CHOL 171 08/16/2009 0916   TRIG 46.0 08/16/2009 0916   HDL 68.70 08/16/2009 0916   CHOLHDL 2 08/16/2009 0916   VLDL 9.2 08/16/2009 0916   LDLCALC 93 08/16/2009 0916     Wt Readings from Last 3 Encounters:  11/07/20 155 lb (70.3 kg)  04/25/20 154 lb 1.6 oz (69.9 kg)  01/09/20 152 lb 9.6 oz (69.2 kg)      Other studies  Reviewed: Additional studies/ records that were reviewed today include: TTE 12/30/16  Review of the above records today demonstrates:  - Left ventricle: The cavity size was normal. Systolic function was   normal. The estimated ejection fraction was in the range of 60%   to 65%. Wall motion was normal; there were no regional wall   motion abnormalities. Left ventricular diastolic function   parameters were normal. - Aortic valve: Trileaflet; normal thickness leaflets. There was no   regurgitation. - Aortic root: The aortic root was normal in size. - Mitral valve: Valve area by pressure half-time: 2.29 cm^2. - Left atrium: The atrium was normal in size. - Right ventricle: Systolic function was normal. - Right atrium: The atrium was normal in size. - Tricuspid valve: There was trivial regurgitation. - Pulmonary arteries: Systolic pressure was within the normal  range. - Inferior vena cava: The vessel was normal in size. The   respirophasic diameter changes were in the normal range (= 50%),   consistent with normal central venous pressure. - Pericardium, extracardiac: There was no pericardial effusion.  Monitor 02/03/17 - personally reviewed On 01-24-17 at 4:14 PM patient experienced 13 beats of ventricular tachycardia - she experienced flutter sensation. Did not have syncope with this episode. She did experience syncope at times during her event monitor, all of which correlated with sinus rhythm/sinus tachycardia No atrial fibrillation, no pauses. Very rare PVC  ASSESSMENT AND PLAN:  1.  Autonomic dysfunction: Likely the cause of most of her palpitations.  Currently on diltiazem.  She is also increased her fluid intake.  She has minimal symptoms on diltiazem has continued to exercise.  No further changes.    2.  Nonsustained VT: No further episodes  3.  PACs: Controlled on diltiazem  Current medicines are reviewed at length with the patient today.   The patient does not have concerns  regarding her medicines.  The following changes were made today: none  Labs/ tests ordered today include:  No orders of the defined types were placed in this encounter.    Disposition:   FU with Akanksha Bellmore 1 months  Signed, Milah Recht Carla Loa, MD  11/07/2020 10:34 AM     [facility] [address] [place] [address] [phone] (office) [phone] (fax)

## 2020-12-10 ENCOUNTER — Other Ambulatory Visit: Payer: Self-pay | Admitting: Cardiology

## 2021-04-12 IMAGING — MR MR SHOULDER*R* W/CM
4 series · 40 of 40 positions shown · IV contrast (agent unspecified)
Comparison: None.

CLINICAL DATA: Right shoulder pain since April 2019.

EXAM:
MR ARTHROGRAM OF THE RIGHT SHOULDER
TECHNIQUE: Multiplanar, multisequence MR imaging of the right shoulder was
performed following the administration of intra-articular contrast.
CONTRAST:  See Injection Documentation.

[Series 3: T1 fat-sat · axial · 4.0mm · 0.47mm/px · z∈[-31,+60]mm · 12 of 22 slices shown (1 of 3)]
[im 1/22]
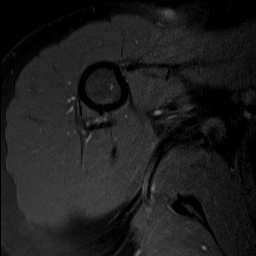
[im 2/22]
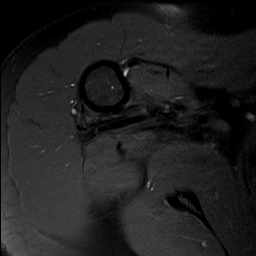
[im 4/22]
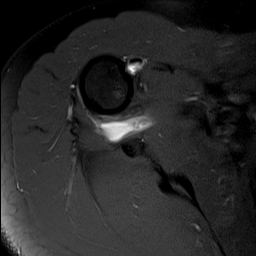
[im 6/22]
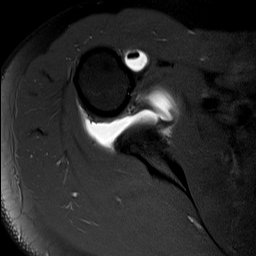
[im 8/22]
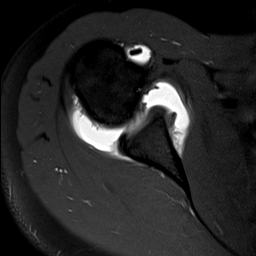
[im 10/22]
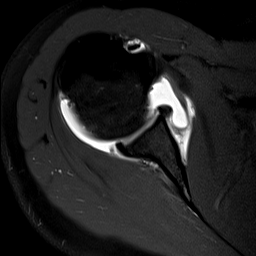
[im 12/22]
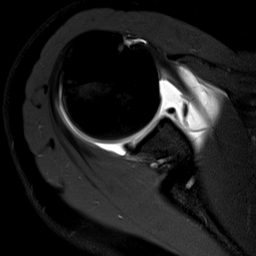
[im 14/22]
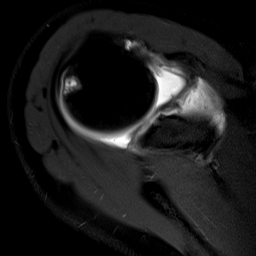
[im 16/22]
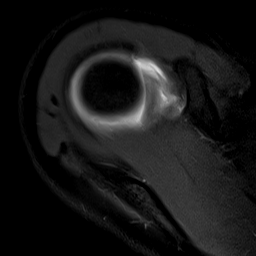
[im 18/22]
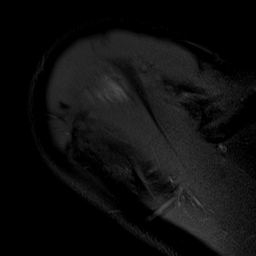
[im 20/22]
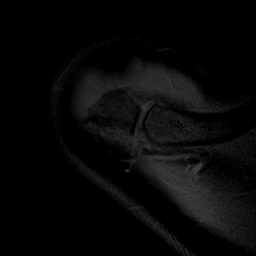
[im 22/22]
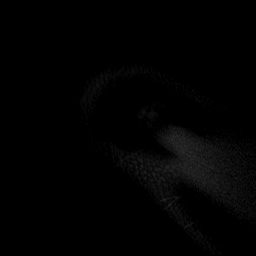

[Series 4: T1 fat-sat · oblique · 4.0mm · 0.55mm/px · 11 of 20 slices shown (2 of 3)]
[im 1/20]
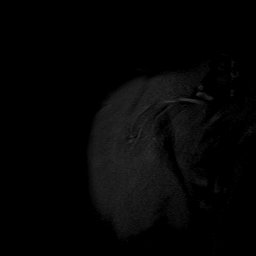
[im 2/20]
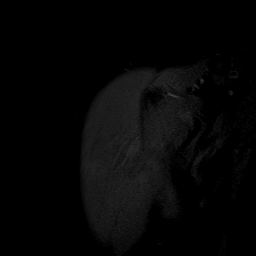
[im 4/20]
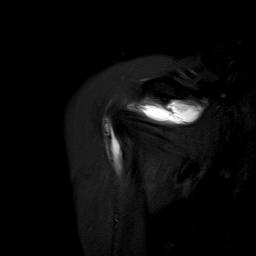
[im 6/20]
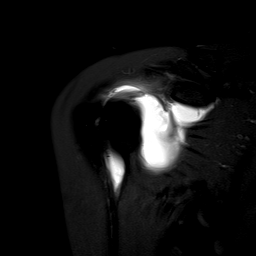
[im 8/20]
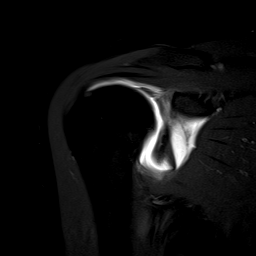
[im 10/20]
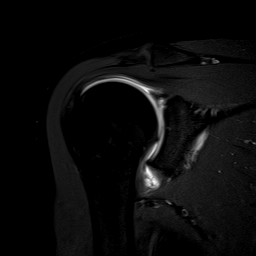
[im 12/20]
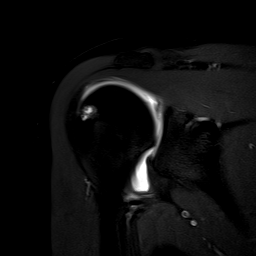
[im 14/20]
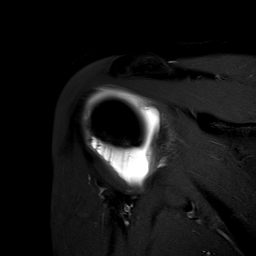
[im 16/20]
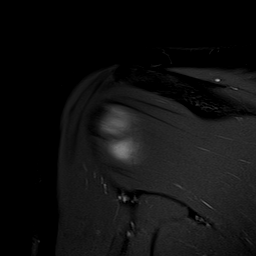
[im 18/20]
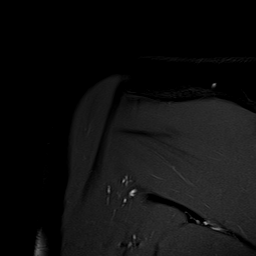
[im 20/20]
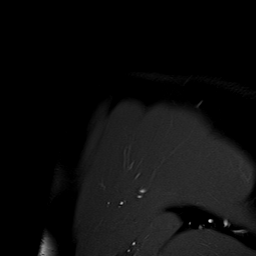

[Series 6: T1 fat-sat · oblique · non-contrast · 4.0mm · 0.44mm/px · 3 of 5 slices shown (3 of 3)]
[im 1/5]
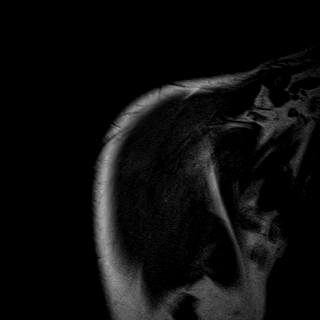
[im 3/5]
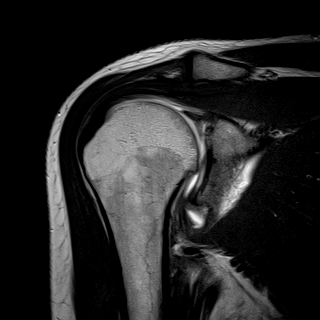
[im 5/5]
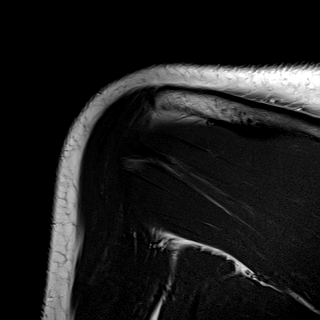

[Series 7: T2 fat-sat · oblique · 4.0mm · 0.55mm/px · 14 of 25 slices shown]
[im 1/25]
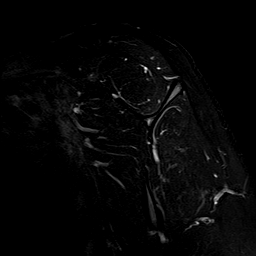
[im 2/25]
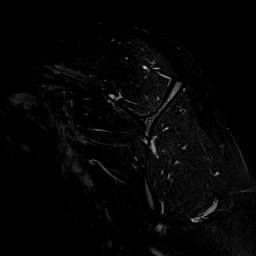
[im 4/25]
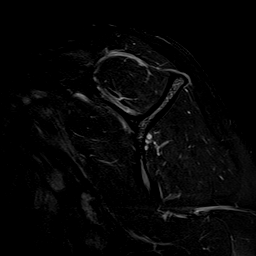
[im 6/25]
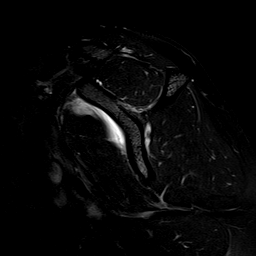
[im 8/25]
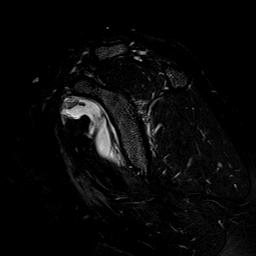
[im 10/25]
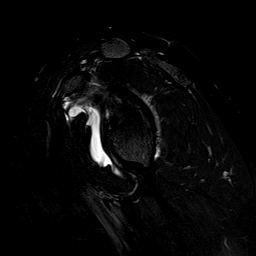
[im 12/25]
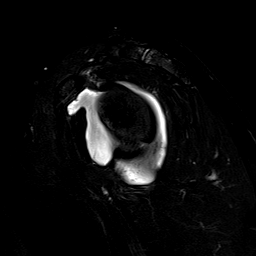
[im 13/25]
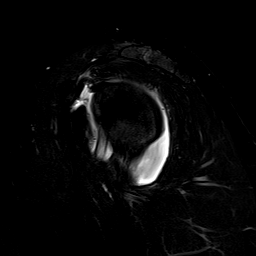
[im 15/25]
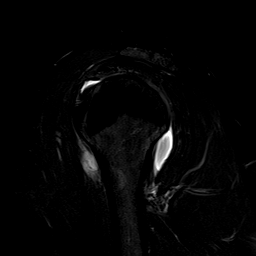
[im 17/25]
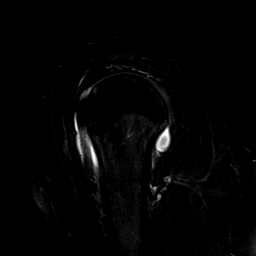
[im 19/25]
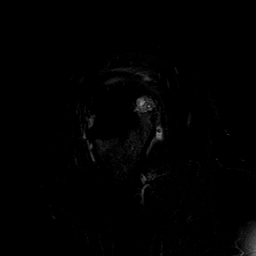
[im 21/25]
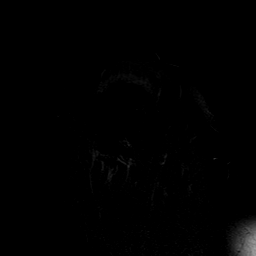
[im 23/25]
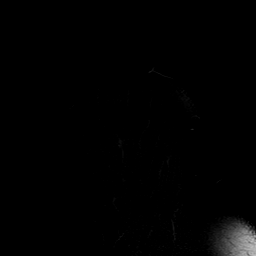
[im 25/25]
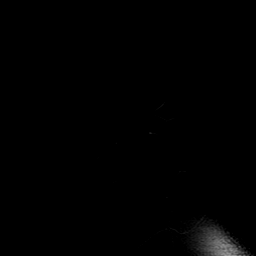

[40 of 40 positions shown; findings below may reference images not displayed]

FINDINGS: Rotator cuff: Normal.

Muscles: No atrophy or edema.

Biceps long head: Properly located and intact.

Acromioclavicular Joint: Normal. Os acromiale. No edema at the
fibrous union. Type 2 configuration. No bursitis.

Glenohumeral Joint: No chondral defect.  No osteophyte formation.

Labrum: There is a SLAP tear demonstrated on series 4. The remainder
of the labrum is intact.

Bones: Small full focal is cystic degenerative changes in the
posterior aspect of the greater tuberosity of the proximal humerus.
Otherwise normal.
IMPRESSION: 1. SLAP tear.
2. Os acromiale.
3. Otherwise, normal exam.
